# Patient Record
Sex: Female | Born: 1945 | Race: White | Hispanic: No | Marital: Single | Smoking: Never smoker
Health system: Southern US, Community
[De-identification: ages and names within clinical notes are randomized; demographics above are authoritative.]

## PROBLEM LIST (undated history)

## (undated) DIAGNOSIS — E785 Hyperlipidemia, unspecified: Secondary | ICD-10-CM

## (undated) DIAGNOSIS — K219 Gastro-esophageal reflux disease without esophagitis: Secondary | ICD-10-CM

## (undated) DIAGNOSIS — R519 Headache, unspecified: Secondary | ICD-10-CM

## (undated) DIAGNOSIS — R51 Headache: Secondary | ICD-10-CM

## (undated) DIAGNOSIS — K2270 Barrett's esophagus without dysplasia: Secondary | ICD-10-CM

## (undated) HISTORY — DX: Gastro-esophageal reflux disease without esophagitis: K21.9

## (undated) HISTORY — DX: Hyperlipidemia, unspecified: E78.5

## (undated) HISTORY — DX: Headache, unspecified: R51.9

## (undated) HISTORY — PX: BREAST BIOPSY: SHX20

## (undated) HISTORY — DX: Headache: R51

## (undated) HISTORY — PX: ABDOMINAL HYSTERECTOMY: SHX81

## (undated) HISTORY — DX: Barrett's esophagus without dysplasia: K22.70

---

## 1998-02-23 ENCOUNTER — Ambulatory Visit (HOSPITAL_COMMUNITY): Admission: RE | Admit: 1998-02-23 | Discharge: 1998-02-23 | Payer: Self-pay | Admitting: Family Medicine

## 1998-02-23 ENCOUNTER — Encounter: Payer: Self-pay | Admitting: Family Medicine

## 1998-07-11 ENCOUNTER — Ambulatory Visit (HOSPITAL_COMMUNITY): Admission: RE | Admit: 1998-07-11 | Discharge: 1998-07-11 | Payer: Self-pay | Admitting: Gastroenterology

## 1998-07-11 ENCOUNTER — Encounter: Payer: Self-pay | Admitting: Gastroenterology

## 1998-08-08 ENCOUNTER — Ambulatory Visit (HOSPITAL_COMMUNITY): Admission: RE | Admit: 1998-08-08 | Discharge: 1998-08-08 | Payer: Self-pay | Admitting: Gastroenterology

## 1998-09-19 ENCOUNTER — Ambulatory Visit (HOSPITAL_COMMUNITY): Admission: RE | Admit: 1998-09-19 | Discharge: 1998-09-19 | Payer: Self-pay | Admitting: Gastroenterology

## 1999-01-15 HISTORY — PX: FACIAL COSMETIC SURGERY: SHX629

## 1999-07-02 ENCOUNTER — Other Ambulatory Visit: Admission: RE | Admit: 1999-07-02 | Discharge: 1999-07-02 | Payer: Self-pay | Admitting: Orthopedic Surgery

## 2000-07-22 ENCOUNTER — Encounter: Admission: RE | Admit: 2000-07-22 | Discharge: 2000-07-22 | Payer: Self-pay | Admitting: Family Medicine

## 2000-07-22 ENCOUNTER — Encounter: Payer: Self-pay | Admitting: Gastroenterology

## 2001-05-05 ENCOUNTER — Ambulatory Visit (HOSPITAL_COMMUNITY): Admission: RE | Admit: 2001-05-05 | Discharge: 2001-05-05 | Payer: Self-pay | Admitting: Gastroenterology

## 2005-07-19 ENCOUNTER — Other Ambulatory Visit: Admission: RE | Admit: 2005-07-19 | Discharge: 2005-07-19 | Payer: Self-pay | Admitting: Obstetrics and Gynecology

## 2006-03-28 ENCOUNTER — Ambulatory Visit (HOSPITAL_BASED_OUTPATIENT_CLINIC_OR_DEPARTMENT_OTHER): Admission: RE | Admit: 2006-03-28 | Discharge: 2006-03-28 | Payer: Self-pay | Admitting: General Surgery

## 2010-02-04 ENCOUNTER — Encounter: Payer: Self-pay | Admitting: General Surgery

## 2012-06-22 ENCOUNTER — Other Ambulatory Visit: Payer: Self-pay | Admitting: Family Medicine

## 2012-06-22 ENCOUNTER — Ambulatory Visit
Admission: RE | Admit: 2012-06-22 | Discharge: 2012-06-22 | Disposition: A | Discharge status: Home / Self Care | Payer: BC Managed Care – PPO | Source: Ambulatory Visit | Attending: Family Medicine | Admitting: Family Medicine

## 2012-06-22 DIAGNOSIS — M549 Dorsalgia, unspecified: Secondary | ICD-10-CM

## 2012-06-22 IMAGING — CR DG LUMBAR SPINE COMPLETE 4+V
5 series · 5 of 5 positions shown · non-contrast
Comparison: None.

CLINICAL DATA: Low back pain

LUMBAR SPINE - COMPLETE 4+ VIEW

[view not recorded (1 of 5)]
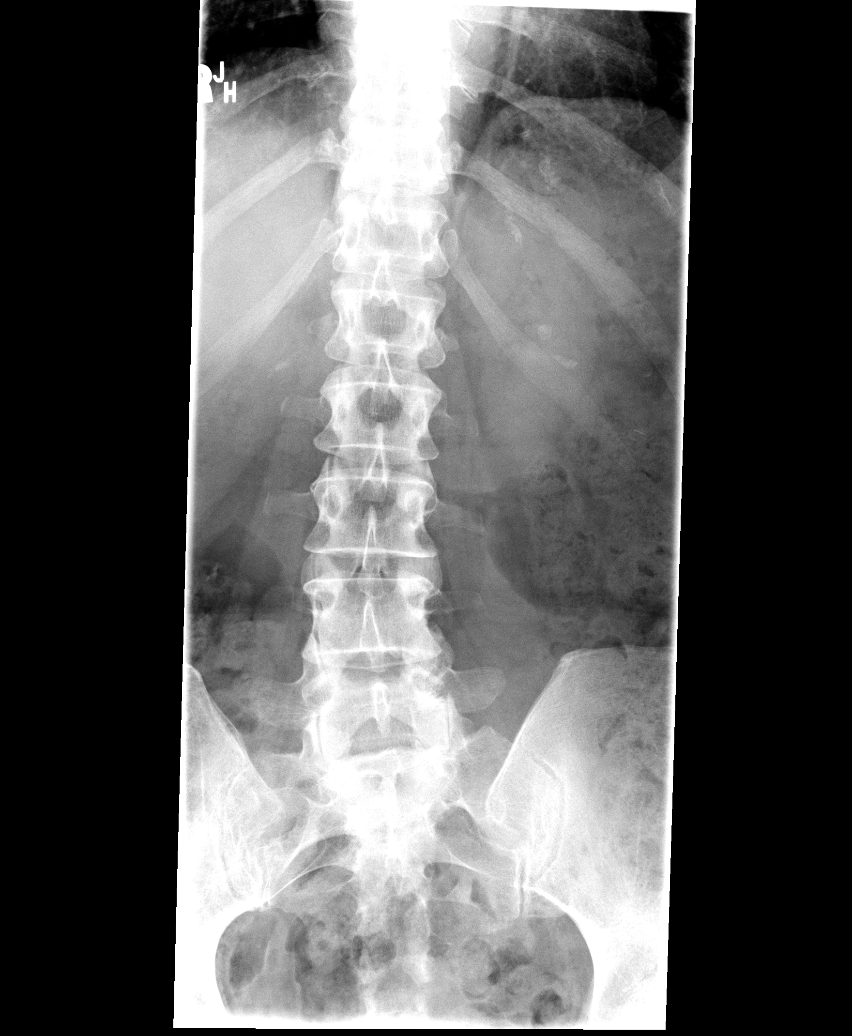

[view not recorded (2 of 5)]
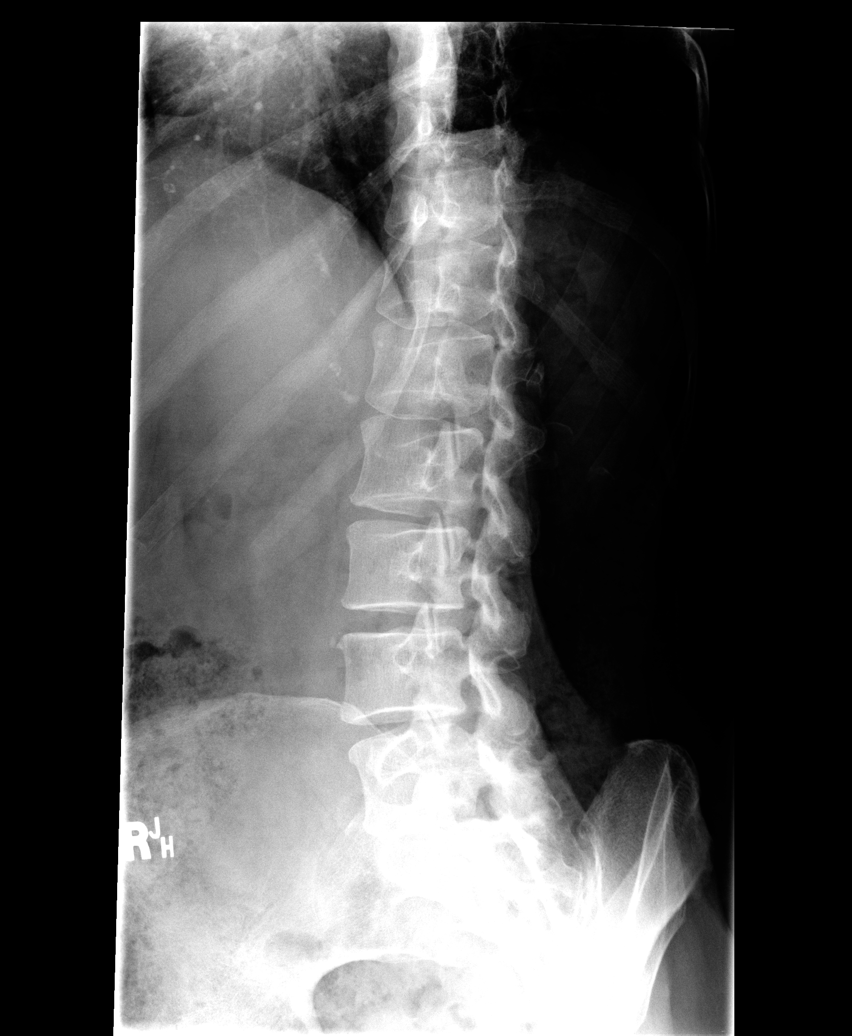

[view not recorded (3 of 5)]
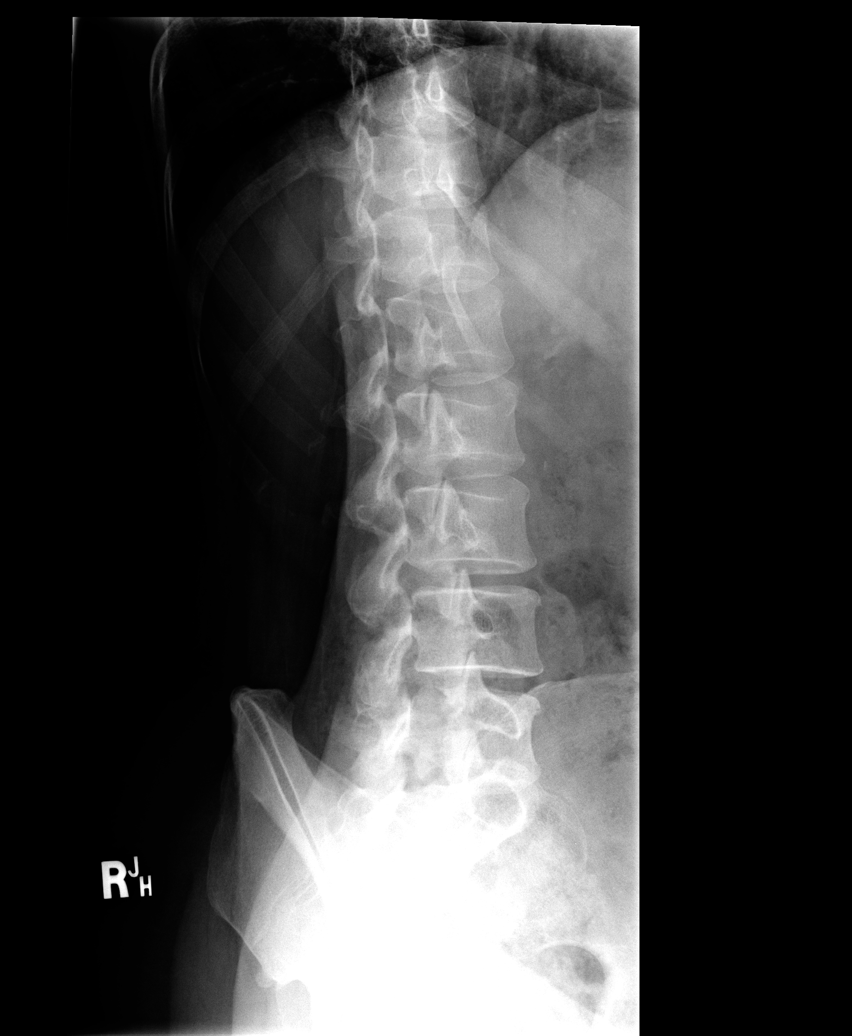

[view not recorded (4 of 5)]
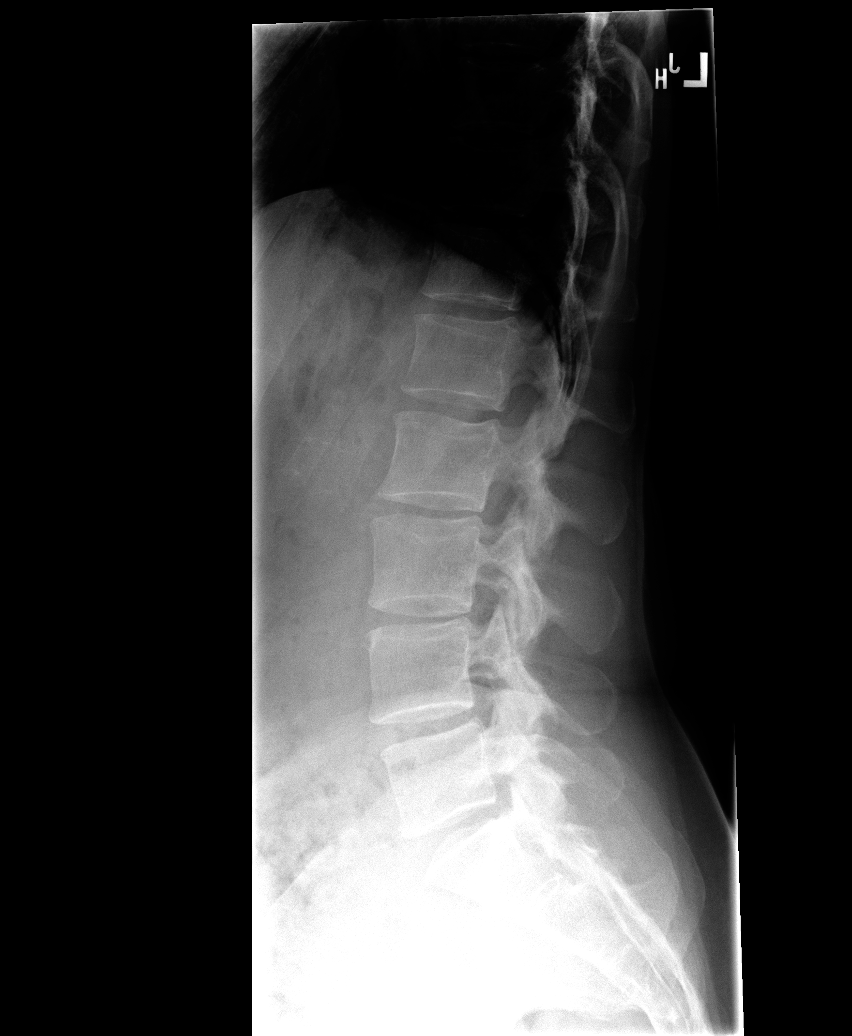

[view not recorded (5 of 5)]
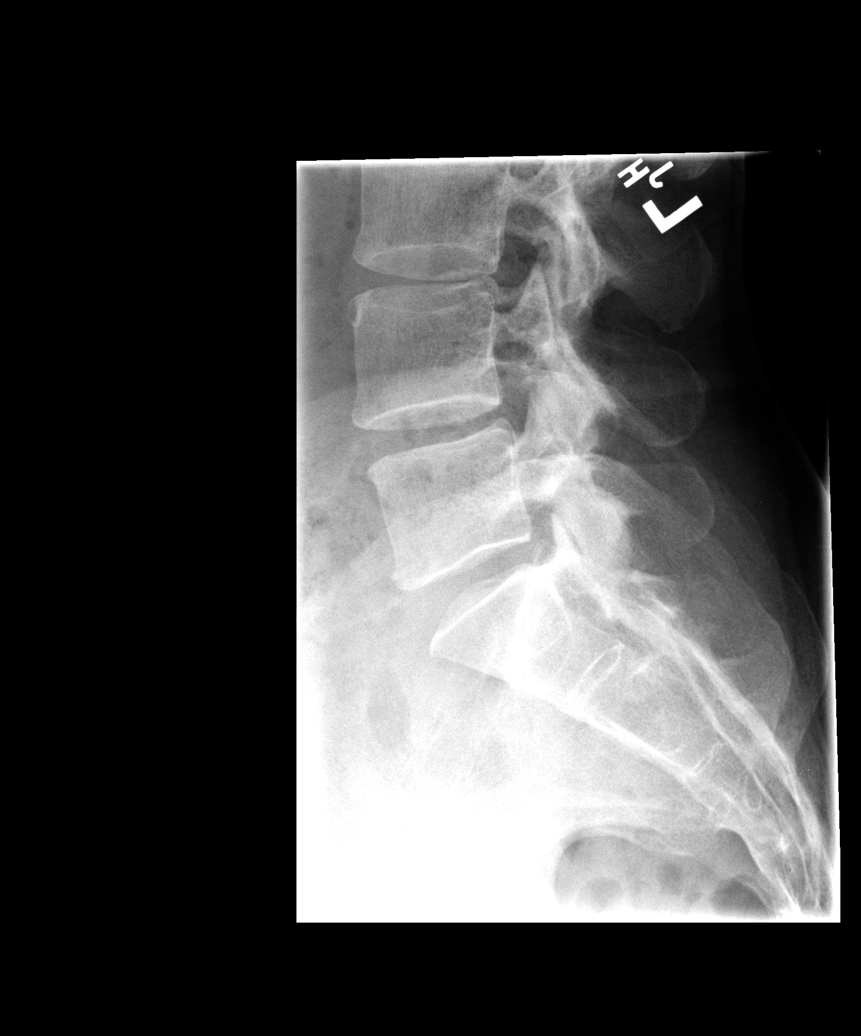

[5 of 5 positions shown; findings below may reference images not displayed]

FINDINGS: Mild dextroscoliosis of the lumbar spine apex L3 without
evident underlying vertebral anomaly.  Mild narrowing L4-5.
Asymmetric facet degenerative hypertrophy L4-5, left greater than
right.    Negative for fracture.
IMPRESSION: 1.  Negative for fracture or other acute bony abnormality.
2.  Degenerative disc and facet disease L4-5.

## 2013-12-03 ENCOUNTER — Other Ambulatory Visit: Payer: Self-pay | Admitting: Family Medicine

## 2013-12-03 DIAGNOSIS — R51 Headache: Principal | ICD-10-CM

## 2013-12-03 DIAGNOSIS — R519 Headache, unspecified: Secondary | ICD-10-CM

## 2013-12-15 ENCOUNTER — Other Ambulatory Visit: Payer: BC Managed Care – PPO

## 2014-02-02 ENCOUNTER — Ambulatory Visit (HOSPITAL_COMMUNITY)
Admission: RE | Admit: 2014-02-02 | Discharge: 2014-02-02 | Disposition: A | Discharge status: Home / Self Care | Payer: BC Managed Care – PPO | Source: Ambulatory Visit | Attending: Neurology | Admitting: Neurology

## 2014-02-02 ENCOUNTER — Ambulatory Visit (INDEPENDENT_AMBULATORY_CARE_PROVIDER_SITE_OTHER): Payer: BC Managed Care – PPO | Admitting: Neurology

## 2014-02-02 ENCOUNTER — Other Ambulatory Visit: Payer: Self-pay | Admitting: Neurology

## 2014-02-02 ENCOUNTER — Encounter: Payer: Self-pay | Admitting: Neurology

## 2014-02-02 VITALS — BP 138/78 | HR 86 | Ht 65.0 in | Wt 136.0 lb

## 2014-02-02 DIAGNOSIS — R51 Headache: Secondary | ICD-10-CM | POA: Diagnosis present

## 2014-02-02 DIAGNOSIS — G4486 Cervicogenic headache: Secondary | ICD-10-CM

## 2014-02-02 DIAGNOSIS — M542 Cervicalgia: Secondary | ICD-10-CM

## 2014-02-02 DIAGNOSIS — M5032 Other cervical disc degeneration, mid-cervical region: Secondary | ICD-10-CM | POA: Diagnosis not present

## 2014-02-02 IMAGING — DX DG CERVICAL SPINE 2 OR 3 VIEWS
2 series · 2 of 2 positions shown · non-contrast
Comparison: None.

CLINICAL DATA: Headaches.

EXAM:
CERVICAL SPINE - 2-3 VIEW

[c-spine lat]
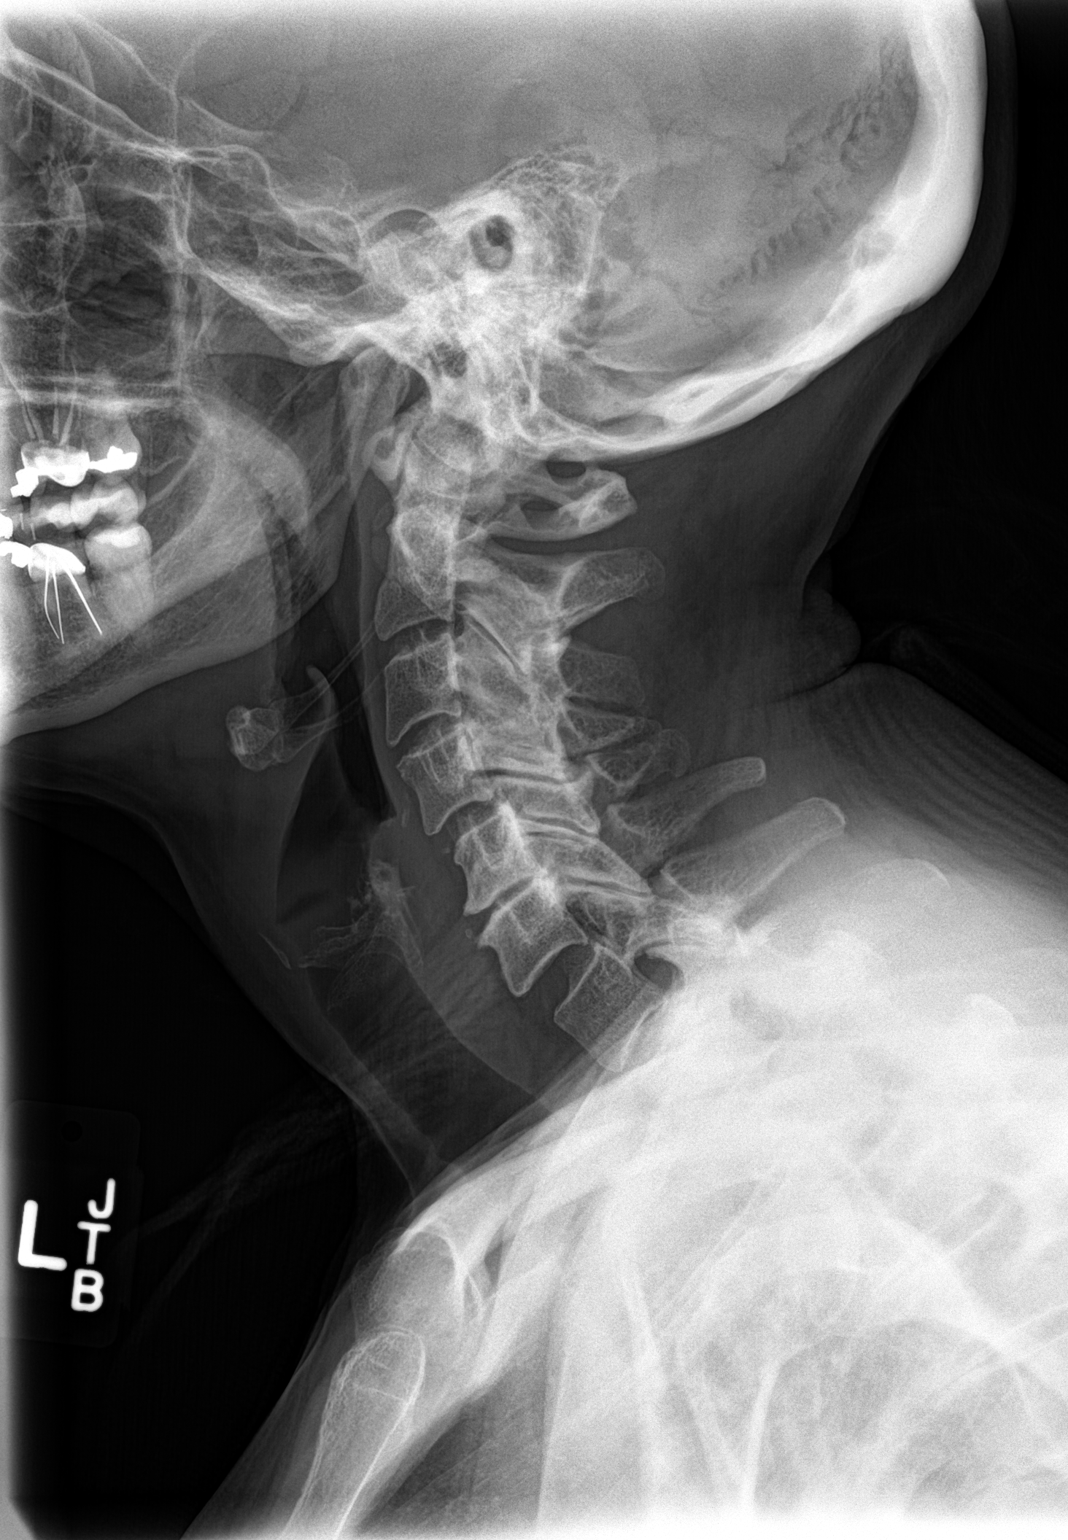

[c-spine ap]
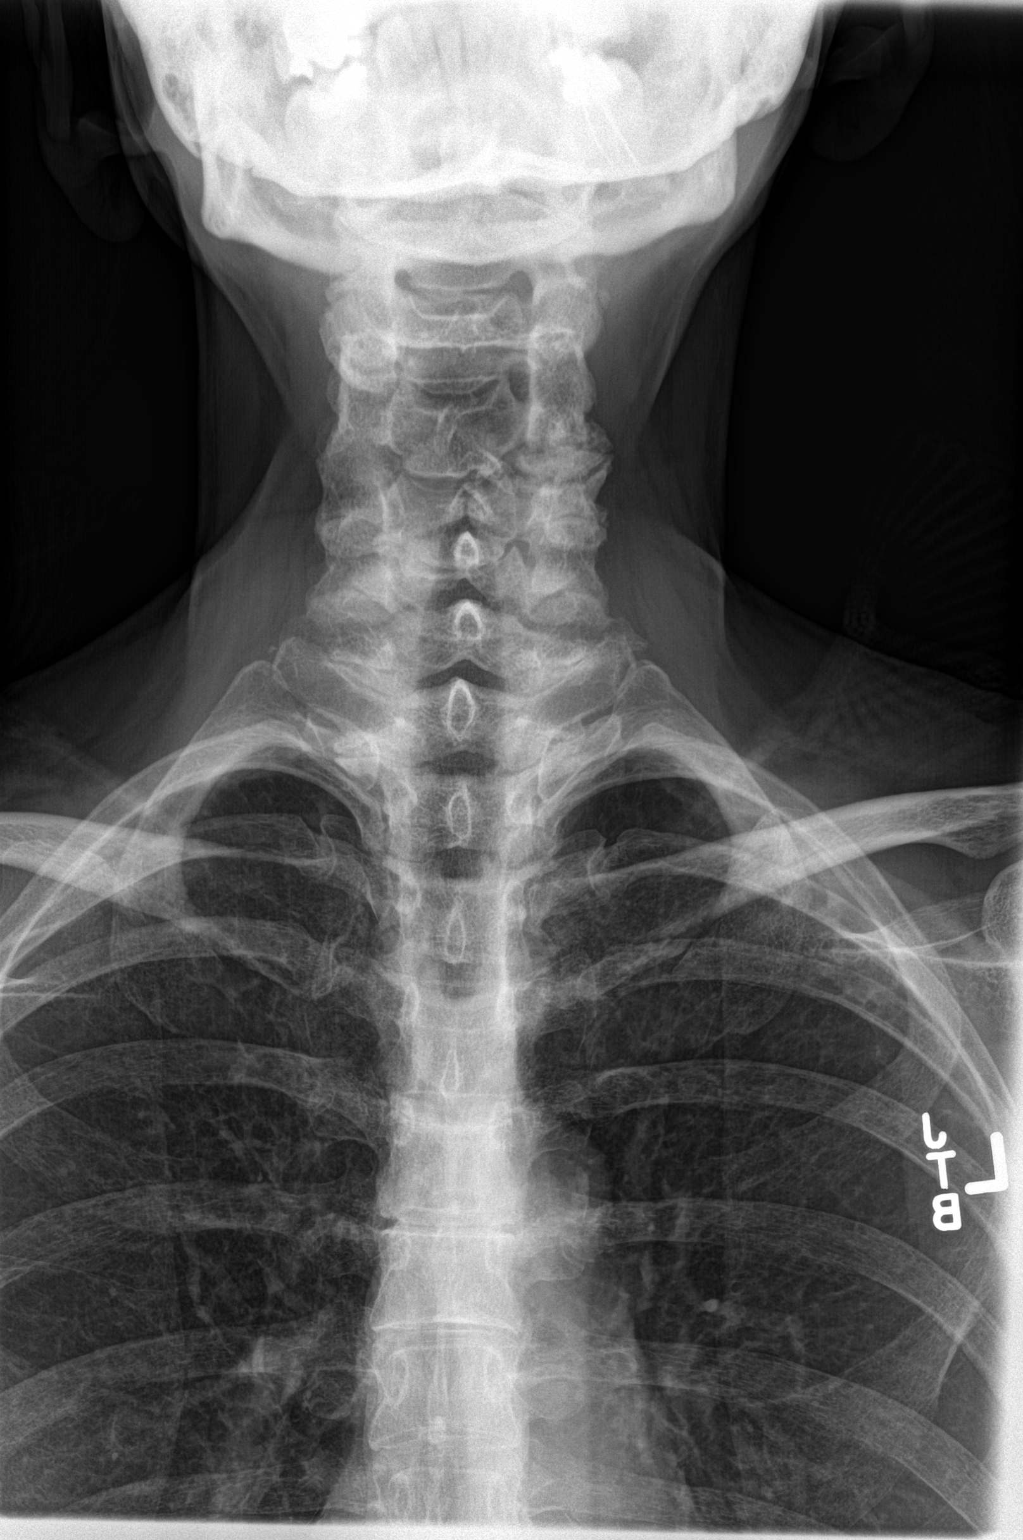

[2 of 2 positions shown; findings below may reference images not displayed]

FINDINGS: The cervical spine is visualized from the occiput to the
cervicothoracic junction. Alignment is anatomic. Vertebral body
height is maintained. Prevertebral soft tissues are within normal
limits. Endplate degenerative changes, loss of disc space height and
uncovertebral hypertrophy are worst at C5-6. Multilevel facet
hypertrophy. Mild biapical pleural parenchymal scarring, left
greater than right, on AP view.
IMPRESSION: Degenerative changes, worst at C5-6.

## 2014-02-02 MED ORDER — METHYLPREDNISOLONE (PAK) 4 MG PO TABS: ORAL_TABLET | ORAL | Status: DC

## 2014-02-02 NOTE — Patient Instructions (Addendum)
I think the headaches are coming from the neck.  1.  We will get an Xray of the cervical spine.  2.  We will also refer you to physical therapy for the neck 3.  I will prescribe you a Medrol Dose Pak, which is a short course of steroid to decrease inflammation and relieve the pain. 4.  I want you to follow up in 4 weeks.  If physical therapy ineffective, we will look at other options as far as tests and treatment. 5.  Also, consider alternating between ice and heat

## 2014-02-02 NOTE — Progress Notes (Signed)
NEUROLOGY CONSULTATION NOTE  [name] MRN: [id number] DOB: November 26, 1945  Referring provider: Dr. Katrinka Blazing Primary care provider: Dr. Katrinka Blazing  Reason for consult:  Headache  HISTORY OF PRESENT ILLNESS: Bailey Ford is a 69 year old right-handed woman with hyperlipidemia who presents for headache.  It started about 5 months ago.  At first, she thought it ws related to allergies, but it persisted.  It is located in the left suboccipital region, radiating to left posterior auricular region.  Intensity of the pain varies, but it is constant.  There is associated neck soreness as well.  Moving her neck to either side aggravates it, but it is more aggravated when turning her neck to the left.  There is no associated numbness or tingling of the scalp or face.  There is no dizziness, visual disturbance, nausea or photophobia or phonophobia.  She denies numbness or tingling in the hands or arms.  She denies any trauma or precipitating strenuous activity.  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  She reports that the head and neck pain was improved during the Christmas break, when she wasn't on the computer all day.  Neck pain is okay in the morning but gradually gets worse throughout the day.  She tried tizanidine which was ineffective.  Tylenol was ineffective.  She currently takes ibuprofen every 4 to 6 hours, which helps relieve it but not abort it.  She has no prior history of headache.   PAST MEDICAL HISTORY: Past Medical History  Diagnosis Date  . Hyperlipidemia   . GERD (gastroesophageal reflux disease)   . Headache     PAST SURGICAL HISTORY: Past Surgical History  Procedure Laterality Date  . Facial cosmetic surgery  2001  . Abdominal hysterectomy    . Breast biopsy      non cancerous    MEDICATIONS: No current outpatient prescriptions on file prior to visit.   No current facility-administered  medications on file prior to visit.    ALLERGIES: No Known Allergies  FAMILY HISTORY: Family History  Problem Relation Age of Onset  . Heart disease Mother   . Heart disease Father   . Liver cancer Sister   . Rheum arthritis Mother   . Pneumonia Brother     SOCIAL HISTORY: History   Social History  . Marital Status: Single    Spouse Name: N/A    Number of Children: N/A  . Years of Education: N/A   Occupational History  . Not on file.   Social History Main Topics  . Smoking status: Never Smoker   . Smokeless tobacco: Not on file  . Alcohol Use: 0.0 oz/week    0 Not specified per week     Comment: once a week  . Drug Use: No  . Sexual Activity: Not on file   Other Topics Concern  . Not on file   Social History Narrative  . No narrative on file    REVIEW OF SYSTEMS: Constitutional: No fevers, chills, or sweats, no generalized fatigue, change in appetite Eyes: No visual changes, double vision, eye pain Ear, nose and throat: No hearing loss, ear pain, nasal congestion, sore throat Cardiovascular: No chest pain, palpitations Respiratory:  No shortness of breath at rest or with exertion, wheezes GastrointestinaI: No nausea, vomiting, diarrhea, abdominal pain, fecal incontinence Genitourinary:  No dysuria, urinary retention or frequency Musculoskeletal:  Neck pain Integumentary: No rash, pruritus, skin  lesions Neurological: as above Psychiatric: No depression, insomnia, anxiety Endocrine: No palpitations, fatigue, diaphoresis, mood swings, change in appetite, change in weight, increased thirst Hematologic/Lymphatic:  No anemia, purpura, petechiae. Allergic/Immunologic: no itchy/runny eyes, nasal congestion, recent allergic reactions, rashes  PHYSICAL EXAM: Filed Vitals:   02/02/14 1511  BP: 138/78  Pulse: 86   General: No acute distress Head:  Normocephalic/atraumatic Eyes:  fundi unremarkable, without vessel changes, exudates, hemorrhages or  papilledema. Neck: supple, left sided suboccipital and paraspinal tenderness, full range of motion Back: No paraspinal tenderness Heart: regular rate and rhythm Lungs: Clear to auscultation bilaterally. Vascular: No carotid bruits. Neurological Exam: Mental status: alert and oriented to person, place, and time, recent and remote memory intact, fund of knowledge intact, attention and concentration intact, speech fluent and not dysarthric, language intact. Cranial nerves: CN I: not tested CN II: pupils equal, round and reactive to light, visual fields intact, fundi unremarkable, without vessel changes, exudates, hemorrhages or papilledema. CN III, IV, VI:  full range of motion, no nystagmus, no ptosis CN V: facial sensation intact CN VII: upper and lower face symmetric CN VIII: hearing intact CN IX, X: gag intact, uvula midline CN XI: sternocleidomastoid and trapezius muscles intact CN XII: tongue midline Bulk & Tone: normal, no fasciculations. Motor:  5/5 throughout Sensation:  Temperature and vibration intact. Deep Tendon Reflexes:  2+ throughout, toes downgoing. Finger to nose testing:  Mild intention tremor.  No dysmetria Gait:  Normal station and stride.  Able to turn and walk in tandem. Romberg negative.  IMPRESSION: Cervicogenic headache.  I don't suspect that there is any intracranial process causing her headache at this time. Cervical neck pain  PLAN: 1.  We will first get an X-ray of the cervical spine to look for any obvious arthritis. 2.  Will prescribe Medrol Dose Pak and referral to PT 3.  Alternate between ice and heat. 4.  She will follow up in 4 weeks.  If PT is ineffective, will look at other testing (such as MRI of the cervical spine) or management.  Thank you for allowing me to take part in the care of this patient.  Shon MilletAdam Lindsee Labarre, DO  CC:  Merri Brunetteandace Smith, MD

## 2014-02-03 ENCOUNTER — Telehealth: Payer: Self-pay | Admitting: *Deleted

## 2014-02-03 NOTE — Telephone Encounter (Signed)
-----   Message from Cira ServantAdam Robert Jaffe, DO sent at 02/03/2014  6:56 AM EST ----- There are some mild degenerative changes in the spine, but nothing too significant.  I would proceed with PT of the neck. ----- Message -----    From: Rad Results In Interface    Sent: 02/02/2014   5:02 PM      To: Cira ServantAdam Robert Jaffe, DO

## 2014-02-03 NOTE — Telephone Encounter (Signed)
Patient is aware of XRAY  and was ask to call next week if she has not  heard from PT

## 2014-02-09 ENCOUNTER — Other Ambulatory Visit: Payer: Self-pay | Admitting: *Deleted

## 2014-02-09 ENCOUNTER — Telehealth: Payer: Self-pay | Admitting: *Deleted

## 2014-02-09 DIAGNOSIS — M542 Cervicalgia: Secondary | ICD-10-CM

## 2014-02-09 NOTE — Telephone Encounter (Signed)
Patient has a att for neuro rhab with PT 02/11/14 at 10:15 am

## 2014-02-11 ENCOUNTER — Ambulatory Visit: Payer: BC Managed Care – PPO | Admitting: Rehabilitative and Restorative Service Providers"

## 2014-02-25 ENCOUNTER — Ambulatory Visit: Payer: BC Managed Care – PPO | Attending: Neurology | Admitting: Rehabilitative and Restorative Service Providers"

## 2014-02-25 DIAGNOSIS — M542 Cervicalgia: Secondary | ICD-10-CM | POA: Diagnosis present

## 2014-02-25 NOTE — Patient Instructions (Addendum)
Thoracic Self-Mobilization (Supine)   With rolled towel placed lengthwise at lower ribs level, lie back on towel with arms outstretched. Hold __2 minutes. Relax. Repeat __1__ times per set. Do _2___ sessions per day.  http://orth.exer.us/1001   Copyright  VHI. All rights reserved.   NECK: Capital Extension   Sitting: Tuck chin, move head and neck backward. Lying Down: Tuck chin, push back of neck into pillow. __10_ reps per set, __1_ sets per day, _2__ days per week  Copyright  VHI. All rights reserved.    AROM: Lateral Neck Flexion   Slowly tilt head toward one shoulder, then the other. Hold each position __10__ seconds. Repeat __5__ times per set.  Do __2__ sessions per day. STAND IN FRONT OF A MIRROR TO ENSURE YOUR SHOULDERS STAY LEVEL. *can be done more as needed to stretch after sitting in front of the computer  http://orth.exer.us/297   Copyright  VHI. All rights reserved.   Shoulder Circle Shrug   Bring shoulders up and rotate around backward. Repeat __10__ times. Do __2__ sessions per day. *Can do more if needed after working on the computer*  http://gt2.exer.us/15   Copyright  VHI. All rights reserved.

## 2014-02-25 NOTE — Therapy (Signed)
[facility] [address] [place], [place], [address] Phone: [phone]   Fax:  [phone]  Physical Therapy Evaluation  Patient Details  Name: Bailey Ford MRN: [id number] Date of Birth: 03/25/1945 Referring Provider:  Allean FoundSmith, Candace Thiele, *  Encounter Date: 02/25/2014      PT End of Session - 02/25/14 1651    Visit Number 1   Number of Visits 4   Date for PT Re-Evaluation 03/26/14   PT Start Time 1448   PT Stop Time 1532   PT Time Calculation (min) 44 min   Activity Tolerance Patient tolerated treatment well   Behavior During Therapy [facility] for tasks assessed/performed      Past Medical History  Diagnosis Date  . Hyperlipidemia   . GERD (gastroesophageal reflux disease)   . Headache     Past Surgical History  Procedure Laterality Date  . Facial cosmetic surgery  2001  . Abdominal hysterectomy    . Breast biopsy      non cancerous    There were no vitals taken for this visit.  Visit Diagnosis:  Neck pain      Subjective Assessment - 02/25/14 1451    Symptoms The patient reports onset of headache and pressure behind her eyes at the end of the summer.  Pt felt this may be related to allergies.  She reports symptoms did not clear up with allergy medications.  She saw Dr. Everlena CooperJaffe for neurology and provided her a steroid pack and she noticed relief initially, but symptoms have returned.     Patient Stated Goals Decrease pain   Currently in Pain? Yes   Pain Score 6    Pain Location Neck   Pain Descriptors / Indicators Aching   Pain Radiating Towards R ear   Pain Onset More than a month ago   Pain Frequency Constant   Aggravating Factors  Wakes up and symptoms not bad, symptoms start mid way through work day.     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          PT Education - 02/25/14 1651    Education provided Yes   Education Details HEP: towel roll stretch, chin tuck, cervical rotation, shoulder rolls  Person(s) Educated Patient   Methods Explanation;Demonstration;Handout   Comprehension Verbalized understanding;Returned demonstration             PT Long Term Goals - 02/25/14 1655    PT LONG TERM GOAL #1   Title The patient will be indep with HEP for post d/c stretching, strengthening.  Target date 03/26/2014.   Time 4   Period Weeks   PT LONG TERM GOAL #2   Title The patient will improve A/ROM rotation to > or equal to 60 degrees bilaterally.  Target date 03/26/2014.   Time 4   Period Weeks   PT LONG TERM GOAL #3   Title The patient will imrpove NDI by 8% (from 20%).  Target date 03/26/2014.   Time 4   Period Weeks   PT LONG TERM GOAL #4   Title The patient will verbalize understanding of work space set up to decrease postural stresses.  Target date 03/26/2014.   Time 4   Period Weeks               Plan - 02/25/14 1652    Clinical Impression Statement The  patient is a 69yo female with neck ROM limitations and tightness noted.  She is active and currently walks, jogs and does push-ups daily.  She spends a large portion of work hours at a computer.  PT to provide HEP, add exercises for upper back strengthening to counterbalance push-ups and computer time, and work set up to optimize body mechanics.     Pt will benefit from skilled therapeutic intervention in order to improve on the following deficits Pain;Impaired flexibility;Postural dysfunction;Decreased range of motion;Decreased mobility   Rehab Potential Good   PT Frequency 1x / week   PT Duration 4 weeks   PT Treatment/Interventions Therapeutic exercise;Patient/family education;Therapeutic activities;Manual techniques   PT Next Visit Plan Check HEP, add upper back t-band strengthening, door frame neural tension stretching, and discuss work space modifications   Consulted and Agree with Plan of Care Patient         Problem List There are no active problems to display for this patient.   Thank you for the referral of this patient.   Lycia Sachdeva, PT 02/25/2014, 4:57 PM  [facility] [address] [place], [place], [address] Phone: [phone]   Fax:  [phone]

## 2014-03-07 ENCOUNTER — Telehealth: Payer: Self-pay | Admitting: Neurology

## 2014-03-07 NOTE — Telephone Encounter (Signed)
Pt called wanting to speak to a nurse to clear up if she needs to be seen by Dr. Everlena CooperJaffe or if she needs to see an orthopedic. Her appt is 03/14/14 C/b [phone]

## 2014-03-07 NOTE — Telephone Encounter (Signed)
Patient is asking should she keep her appt on Monday 03/14/14  With Dr Everlena CooperJaffe? She states PT has  Helped a little however they as well seem to think this is muscle related not neuro . She also ask does she need to see a ortho Dr ? Please advise

## 2014-03-07 NOTE — Telephone Encounter (Signed)
I thought that the neck pain is related to muscle spasms, which in turn contributes to the headaches.  If she is feeling better, then she doesn't need to follow up.  Otherwise, she should keep her appointment.  From my standpoint, I don't think an orthopedist is warranted.

## 2014-03-07 NOTE — Telephone Encounter (Signed)
Patient will keep her follow up appt since she is still having the headache just not as bad

## 2014-03-14 ENCOUNTER — Encounter: Payer: Self-pay | Admitting: Neurology

## 2014-03-14 ENCOUNTER — Ambulatory Visit (INDEPENDENT_AMBULATORY_CARE_PROVIDER_SITE_OTHER): Payer: BC Managed Care – PPO | Admitting: Neurology

## 2014-03-14 VITALS — BP 118/68 | HR 66 | Resp 20 | Ht 65.0 in | Wt 136.4 lb

## 2014-03-14 DIAGNOSIS — R51 Headache: Secondary | ICD-10-CM

## 2014-03-14 DIAGNOSIS — M542 Cervicalgia: Secondary | ICD-10-CM | POA: Insufficient documentation

## 2014-03-14 DIAGNOSIS — G4486 Cervicogenic headache: Secondary | ICD-10-CM

## 2014-03-14 NOTE — Progress Notes (Signed)
NEUROLOGY FOLLOW UP OFFICE NOTE  Bailey AppleShelly J Ford [id number]  HISTORY OF PRESENT ILLNESS: Bailey GuernseyShelly Ford is a 69 year old right-handed woman with hyperlipidemia who follows up for neck pain and cervicogenic headache.  X-ray of cervical spine reviewed.  UPDATE: She had an X-ray of the cervical spine on 02/02/14 which showed some degenerative changes, worst at C5-6.  She was prescribed a Medrol Dose Pak and referred to PT.  The headache and neck pain improved after the Dose Pak for a little while.  She only went to one session with PT because they were booked out.  They gave her home exercises, which have minimally helped.  The headache isn't constant but it comes and goes at various times.  She is not interested in medications such as TCA or muscle relaxant.  HISTORY: It started about 7 months ago.  At first, she thought it ws related to allergies, but it persisted.  It is located in the left suboccipital region, radiating to left posterior auricular region.  Intensity of the pain varies, but it is constant.  There is associated neck soreness as well.  Moving her neck to either side aggravates it, but it is more aggravated when turning her neck to the left.  There is no associated numbness or tingling of the scalp or face.  There is no dizziness, visual disturbance, nausea or photophobia or phonophobia.  She denies numbness or tingling in the hands or arms.  She denies any trauma or precipitating strenuous activity.  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  She reports that the head and neck pain was improved during the Christmas break, when she wasn't on the computer all day.  Neck pain is okay in the morning but gradually gets worse throughout the day.  She tried tizanidine which was ineffective.  Tylenol was ineffective.  She currently takes ibuprofen every 4 to 6 hours, which helps relieve it but not abort it.  She  has no prior history of headache  PAST MEDICAL HISTORY: Past Medical History  Diagnosis Date  . Hyperlipidemia   . GERD (gastroesophageal reflux disease)   . Headache     MEDICATIONS: Current Outpatient Prescriptions on File Prior to Visit  Medication Sig Dispense Refill  . atorvastatin (LIPITOR) 10 MG tablet Take 10 mg by mouth daily.   0  . BIOTIN PO Take by mouth daily.    Marland Kitchen. CALCIUM PO Take by mouth daily.    . Fish Oil-Cholecalciferol (FISH OIL + D3 PO) Take by mouth daily.    Marland Kitchen. ibuprofen (ADVIL,MOTRIN) 600 MG tablet Take 600 mg by mouth every 6 (six) hours as needed.    . Multiple Vitamin (MULTIVITAMIN) tablet Take 1 tablet by mouth daily.    . pantoprazole (PROTONIX) 40 MG tablet Take 40 mg by mouth daily.   5  . PREMARIN 1.25 MG tablet Take 1.25 mg by mouth daily.   4  . methylPREDNIsolone (MEDROL DOSPACK) 4 MG tablet follow package directions (Patient not taking: Reported on 03/14/2014) 21 tablet 0   No current facility-administered medications on file prior to visit.    ALLERGIES: No Known Allergies  FAMILY HISTORY: Family History  Problem Relation Age of Onset  . Heart disease Mother   . Heart disease Father   . Liver cancer Sister   . Rheum arthritis Mother   . Pneumonia Brother     SOCIAL HISTORY: History  Social History  . Marital Status: Single    Spouse Name: N/A  . Number of Children: N/A  . Years of Education: N/A   Occupational History  . Not on file.   Social History Main Topics  . Smoking status: Never Smoker   . Smokeless tobacco: Never Used  . Alcohol Use: 0.0 oz/week    0 Standard drinks or equivalent per week     Comment: once a week  . Drug Use: No  . Sexual Activity:    Partners: Male   Other Topics Concern  . Not on file   Social History Narrative    REVIEW OF SYSTEMS: Constitutional: No fevers, chills, or sweats, no generalized fatigue, change in appetite Eyes: No visual changes, double vision, eye pain Ear, nose and  throat: No hearing loss, ear pain, nasal congestion, sore throat Cardiovascular: No chest pain, palpitations Respiratory:  No shortness of breath at rest or with exertion, wheezes GastrointestinaI: No nausea, vomiting, diarrhea, abdominal pain, fecal incontinence Genitourinary:  No dysuria, urinary retention or frequency Musculoskeletal:  Neck pain Integumentary: No rash, pruritus, skin lesions Neurological: as above Psychiatric: No depression, insomnia, anxiety Endocrine: No palpitations, fatigue, diaphoresis, mood swings, change in appetite, change in weight, increased thirst Hematologic/Lymphatic:  No anemia, purpura, petechiae. Allergic/Immunologic: no itchy/runny eyes, nasal congestion, recent allergic reactions, rashes  PHYSICAL EXAM: Filed Vitals:   03/14/14 1419  BP: 118/68  Pulse: 66  Resp: 20   General: No acute distress Head:  Normocephalic/atraumatic Eyes:  Fundoscopic exam unremarkable without vessel changes, exudates, hemorrhages or papilledema. Neck: supple, bilateral tenderness, full range of motion Heart:  Regular rate and rhythm Lungs:  Clear to auscultation bilaterally Back: No paraspinal tenderness Neurological Exam: alert and oriented to person, place, and time. Attention span and concentration intact, recent and remote memory intact, fund of knowledge intact.  Speech fluent and not dysarthric, language intact.  CN II-XII intact. Fundoscopic exam unremarkable without vessel changes, exudates, hemorrhages or papilledema.  Bulk and tone normal, muscle strength 5/5 throughout.  Sensation to light touch intact.  Deep tendon reflexes 2+ throughout.  Finger to nose testing intact.  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  Since she would rather not take medication, we will refer her to Dr. Antoine Primas of Sports Medicine  2.  She will follow up in 3 months  15 minutes spent with patient, over 50% spent  discussing etiology of pain and coordinating management.  Shon Millet, DO  CC:  Merri Brunette, MD

## 2014-03-14 NOTE — Patient Instructions (Signed)
1.  We will refer you to Dr. Antoine PrimasZachary Smith. 2.  Follow up 3 months

## 2014-03-21 ENCOUNTER — Encounter: Payer: Self-pay | Admitting: Family Medicine

## 2014-03-21 ENCOUNTER — Ambulatory Visit (INDEPENDENT_AMBULATORY_CARE_PROVIDER_SITE_OTHER): Payer: BC Managed Care – PPO | Admitting: Family Medicine

## 2014-03-21 ENCOUNTER — Encounter: Payer: Self-pay | Admitting: *Deleted

## 2014-03-21 VITALS — BP 126/72 | HR 65 | Ht 65.0 in | Wt 135.0 lb

## 2014-03-21 DIAGNOSIS — G4486 Cervicogenic headache: Secondary | ICD-10-CM

## 2014-03-21 DIAGNOSIS — R51 Headache: Secondary | ICD-10-CM | POA: Diagnosis not present

## 2014-03-21 DIAGNOSIS — M999 Biomechanical lesion, unspecified: Secondary | ICD-10-CM

## 2014-03-21 DIAGNOSIS — M9901 Segmental and somatic dysfunction of cervical region: Secondary | ICD-10-CM

## 2014-03-21 NOTE — Progress Notes (Signed)
  Tawana ScaleZach Smith D.O. [facility] [address] [place], [place] [address] Phone: [phone] Subjective:    I'm seeing this patient by the request  of:  Everlena CooperJaffe, MD  CC: Headaches  BJY:NWGNFAOZHYHPI:Subjective Romeo AppleShelly J Arlen is a 69 y.o. female coming in with complaint of headaches. Patient has had headaches off and on for quite some time. Patient states it seemed to get worse over the course of the fall and unfortunate since then has had this intermittent spontaneous headaches. Patient states it seems to only start on the right side of her neck and then radiates towards the right side of her head. Denies any throbbing, denies any numbness or tingling in the extremities, denies any photophobia or any nausea. Patient states that it can be fairly painful and she tries to not take any medications for. Does not stop her from any activities but does notice when she does a lot of more repetitive activity at work such as looking at her computer screens she can have more discomfort. Patient has been seen by neurology and neurologic exam was unremarkable. Patient has had x-rays of her neck that shows some mild degenerative changes mostly at C5-C6. These were reviewed by me today.   rates the severity of pain is 6 out of 10.  Past medical history, social, surgical and family history all reviewed in electronic medical record.   Review of Systems: No headache, visual changes, nausea, vomiting, diarrhea, constipation, dizziness, abdominal pain, skin rash, fevers, chills, night sweats, weight loss, swollen lymph nodes, body aches, joint swelling, muscle aches, chest pain, shortness of breath, mood changes.   Objective Blood pressure 126/72, pulse 65, height 5\' 5"  (1.651 m), weight 135 lb (61.236 kg), SpO2 97 %.  General: No apparent distress alert and oriented x3 mood and affect normal, dressed appropriately.  HEENT: Pupils equal, extraocular movements intact  Respiratory: Patient's speak in full sentences  and does not appear short of breath  Cardiovascular: No lower extremity edema, non tender, no erythema  Skin: Warm dry intact with no signs of infection or rash on extremities or on axial skeleton.  Abdomen: Soft nontender  Neuro: Cranial nerves II through XII are intact, neurovascularly intact in all extremities with 2+ DTRs and 2+ pulses.  Lymph: No lymphadenopathy of posterior or anterior cervical chain or axillae bilaterally.  Gait normal with good balance and coordination.  MSK:  Non tender with full range of motion and good stability and symmetric strength and tone of shoulders, elbows, wrist, hip, knee and ankles bilaterally.  Neck: Inspection unremarkable. She no does have significant increased kyphosis of the thoracic spine No palpable stepoffs. Negative Spurling's maneuver. Full neck range of motion Grip strength and sensation normal in bilateral hands Strength good C4 to T1 distribution No sensory change to C4 to T1 Negative Hoffman sign bilaterally Reflexes normal Tender over C2.  Osteopathic findings Cervical C2 flexed rotated and side bent left C4 flexed rotated and side bent right Thoracic T5 extended rotated and side bent right  Procedure note 97110; 15 minutes spent for Therapeutic exercises as stated in above notes.  This included exercises focusing on stretching, strengthening, with significant focus on eccentric aspects.   Proper technique shown and discussed handout in great detail with ATC.  All questions were discussed and answered.      Impression and Recommendations:     This case required medical decision making of moderate complexity.

## 2014-03-21 NOTE — Patient Instructions (Addendum)
Good to see you Consider standing desk.  Increase omega 3 foods and decrease omega 6 foods.  Vitamin D 2000 IU daily Turmeric  twice daily Fish oil 2 grams daily Consider limiting caffeine and chocolate a little bit 2 tennis ball in tube sock and lay on it when feel tightening go away.  Tennis ball between shoulder blades when sitting.  Try exercises 3 times a week.  See me again in 3 weeks      Standing:  Secure a rubber exercise band/tubing so that it is at the height of your shoulders when you are either standing or sitting on a firm arm-less chair.  Grasp an end of the band/tubing in each hand and have your palms face each other. Straighten your elbows and lift your hands straight in front of you at shoulder height. Step back away from the secured end of band/tubing until it becomes tense.  Squeeze your shoulder blades together. Keeping your elbows locked and your hands at shoulder-height, bring your hands out to your side.  Hold __________ seconds. Slowly ease the tension on the band/tubing as you reverse the directions and return to the starting position. Repeat __________ times. Complete this exercise __________ times per day. STRENGTH - Scapular Retractors  Secure a rubber exercise band/tubing so that it is at the height of your shoulders when you are either standing or sitting on a firm arm-less chair.  With a palm-down grip, grasp an end of the band/tubing in each hand. Straighten your elbows and lift your hands straight in front of you at shoulder height. Step back away from the secured end of band/tubing until it becomes tense.  Squeezing your shoulder blades together, draw your elbows back as you bend them. Keep your upper arm lifted away from your body throughout the exercise.  Hold __________ seconds. Slowly ease the tension on the band/tubing as you reverse the directions and return to the starting position. Repeat __________ times. Complete this exercise  __________ times per day. STRENGTH - Shoulder Extensors   Secure a rubber exercise band/tubing so that it is at the height of your shoulders when you are either standing or sitting on a firm arm-less chair.  With a thumbs-up grip, grasp an end of the band/tubing in each hand. Straighten your elbows and lift your hands straight in front of you at shoulder height. Step back away from the secured end of band/tubing until it becomes tense.  Squeezing your shoulder blades together, pull your hands down to the sides of your thighs. Do not allow your hands to go behind you.  Hold for __________ seconds. Slowly ease the tension on the band/tubing as you reverse the directions and return to the starting position. Repeat __________ times. Complete this exercise __________ times per day.  STRENGTH - Scapular Retractors and External Rotators  Secure a rubber exercise band/tubing so that it is at the height of your shoulders when you are either standing or sitting on a firm arm-less chair.  With a palm-down grip, grasp an end of the band/tubing in each hand. Bend your elbows 90 degrees and lift your elbows to shoulder height at your sides. Step back away from the secured end of band/tubing until it becomes tense.  Squeezing your shoulder blades together, rotate your shoulder so that your upper arm and elbow remain stationary, but your fists travel upward to head-height.  Hold __________ for seconds. Slowly ease the tension on the band/tubing as you reverse the directions and return to the  starting position. Repeat __________ times. Complete this exercise __________ times per day.  STRENGTH - Scapular Retractors and External Rotators, Rowing  Secure a rubber exercise band/tubing so that it is at the height of your shoulders when you are either standing or sitting on a firm arm-less chair.  With a palm-down grip, grasp an end of the band/tubing in each hand. Straighten your elbows and lift your hands  straight in front of you at shoulder height. Step back away from the secured end of band/tubing until it becomes tense.  Step 1: Squeeze your shoulder blades together. Bending your elbows, draw your hands to your chest as if you are rowing a boat. At the end of this motion, your hands and elbow should be at shoulder-height and your elbows should be out to your sides.  Step 2: Rotate your shoulder to raise your hands above your head. Your forearms should be vertical and your upper-arms should be horizontal.  Hold for __________ seconds. Slowly ease the tension on the band/tubing as you reverse the directions and return to the starting position. Repeat __________ times. Complete this exercise __________ times per day.  STRENGTH - Scapular Retractors and Elevators  Secure a rubber exercise band/tubing so that it is at the height of your shoulders when you are either standing or sitting on a firm arm-less chair.  With a thumbs-up grip, grasp an end of the band/tubing in each hand. Step back away from the secured end of band/tubing until it becomes tense.  Squeezing your shoulder blades together, straighten your elbows and lift your hands straight over your head.  Hold for __________ seconds. Slowly ease the tension on the band/tubing as you reverse the directions and return to the starting position. Repeat __________ times. Complete this exercise __________ times per day.  Document Released: 12/31/2004 Document Revised: 03/25/2011 Document Reviewed: 04/14/2008 [facility] Patient Information 2015 [employer] [place]. This information is not intended to replace advice given to you by your health care provider. Make sure you discuss any questions you have with your health care provider.

## 2014-03-21 NOTE — Progress Notes (Signed)
Pre visit review using our clinic review tool, if applicable. No additional management support is needed unless otherwise documented below in the visit note. 

## 2014-03-21 NOTE — Assessment & Plan Note (Signed)
Decision today to treat with OMT was based on Physical Exam  After verbal consent patient was treated with ME, FPr techniques in cervical and thoracic areas  Patient tolerated the procedure well with improvement in symptoms  Patient given exercises, stretches and lifestyle modifications  See medications in patient instructions if given  Patient will follow up in 3 weeks

## 2014-03-21 NOTE — Assessment & Plan Note (Addendum)
Patient was unable to tolerate a significant amount of osteopathic manipulation today due to patient's severity of pain. We discussed home exercises, natural supplementations, as well as an icing protocol and self manual techniques. We discussed different postural changes patient will do and patient was given a note for a standing desk. We discussed that the weakness in the thoracic spine is likely contributing to some of the neck pain and will focus on scapular strengthening. Patient did work with the Event organiserathletic trainer today. We discussed different diet changes patient can potentially make as well. Patient will try to keep a journal of her headaches to see if we can figure out any other triggers. Patient and will come back and see me again in 3 weeks for further evaluation.

## 2014-04-11 ENCOUNTER — Encounter: Payer: Self-pay | Admitting: Family Medicine

## 2014-04-11 ENCOUNTER — Ambulatory Visit (INDEPENDENT_AMBULATORY_CARE_PROVIDER_SITE_OTHER): Payer: BC Managed Care – PPO | Admitting: Family Medicine

## 2014-04-11 VITALS — BP 104/62 | HR 87 | Ht 65.0 in | Wt 135.0 lb

## 2014-04-11 DIAGNOSIS — R51 Headache: Secondary | ICD-10-CM

## 2014-04-11 DIAGNOSIS — G4486 Cervicogenic headache: Secondary | ICD-10-CM

## 2014-04-11 DIAGNOSIS — M9902 Segmental and somatic dysfunction of thoracic region: Secondary | ICD-10-CM | POA: Diagnosis not present

## 2014-04-11 DIAGNOSIS — M9903 Segmental and somatic dysfunction of lumbar region: Secondary | ICD-10-CM

## 2014-04-11 DIAGNOSIS — M999 Biomechanical lesion, unspecified: Secondary | ICD-10-CM | POA: Insufficient documentation

## 2014-04-11 DIAGNOSIS — M9901 Segmental and somatic dysfunction of cervical region: Secondary | ICD-10-CM

## 2014-04-11 MED ORDER — DICLOFENAC SODIUM 2 % TD SOLN: TRANSDERMAL | Status: DC

## 2014-04-11 NOTE — Patient Instructions (Signed)
Good to see you Posture posture and posture I am excited about the standing desk Continue the vitamins Try the pennsaid topically up to 2 times a day as needed Keep head in neutral with repetitive 2 tennisball in tube sock and place where head meets neck.  See me again in 3-4 weeks.

## 2014-04-11 NOTE — Assessment & Plan Note (Signed)
Patient has some headaches overall. I do think that likely this is still secondary to her posture. Patient is to be getting a stand up desk at work which I think with beneficial. We discussed icing as well as patient was given topical anti-inflammatory. This will avoid any significant headaches that could be secondary to the oral anti-inflammatories. Discussed with patient about different postural changes she can make with her ergonomic changes during the day. Patient and will come back and see me again in 3-4 weeks for further evaluation and treatment

## 2014-04-11 NOTE — Assessment & Plan Note (Signed)
Decision today to treat with OMT was based on Physical Exam  After verbal consent patient was treated with ME, FPr techniques in cervical and thoracic and lumbar areas  Patient tolerated the procedure well with improvement in symptoms  Patient given exercises, stretches and lifestyle modifications  See medications in patient instructions if given  Patient will follow up in 3-4 weeks

## 2014-04-11 NOTE — Progress Notes (Signed)
  Tawana ScaleZach Rameses Ou D.O. [facility] [address] [place], [place] [address] Phone: [phone] Subjective:    CC: Headaches follow-up  BJY:NWGNFAOZHYHPI:Subjective Bailey Ford is a 69 y.o. female coming in with complaint of headaches. 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 Patient was given home exercises especially for strengthening the scapula. Patient has been doing them regularly. Patient has noticed some decrease in her pain but continues to have flares from time to time. Patient states repetitive activity seems to make it difficult. Denies any radiation down the arms or any numbness or [place]. Not stopping her from any of her daily activities.. Patient has had x-rays of her neck that shows some mild degenerative changes mostly at C5-C6. These were reviewed by me today.     Past medical history, social, surgical and family history all reviewed in electronic medical record.   Review of Systems: No headache, visual changes, nausea, vomiting, diarrhea, constipation, dizziness, abdominal pain, skin rash, fevers, chills, night sweats, weight loss, swollen lymph nodes, body aches, joint swelling, muscle aches, chest pain, shortness of breath, mood changes.   Objective Blood pressure 104/62, pulse 87, height 5\' 5"  (1.651 m), weight 135 lb (61.236 kg), SpO2 96 %.  General: No apparent distress alert and oriented x3 mood and affect normal, dressed appropriately.  HEENT: Pupils equal, extraocular movements intact  Respiratory: Patient's speak in full sentences and does not appear short of breath  Cardiovascular: No lower extremity edema, non tender, no erythema  Skin: Warm dry intact with no signs of infection or rash on extremities or on axial skeleton.  Abdomen: Soft nontender  Neuro: Cranial nerves II through XII are intact, neurovascularly intact in all extremities with 2+ DTRs and 2+ pulses.  Lymph: No  lymphadenopathy of posterior or anterior cervical chain or axillae bilaterally.  Gait normal with good balance and coordination.  MSK:  Non tender with full range of motion and good stability and symmetric strength and tone of shoulders, elbows, wrist, hip, knee and ankles bilaterally.  Neck: Inspection unremarkable. She no does have significant increased kyphosis of the thoracic spine No palpable stepoffs. Negative Spurling's maneuver. Full neck range of motion Grip strength and sensation normal in bilateral hands Strength good C4 to T1 distribution No sensory change to C4 to T1 Negative Hoffman sign bilaterally Reflexes normal No change from previous exam  Osteopathic findings Cervical C2 flexed rotated and side bent left C4 flexed rotated and side bent right Thoracic T5 extended rotated and side bent right Lumbar L2 flexed rotated and side bent right      Impression and Recommendations:     This case required medical decision making of moderate complexity.

## 2014-04-11 NOTE — Progress Notes (Signed)
Pre visit review using our clinic review tool, if applicable. No additional management support is needed unless otherwise documented below in the visit note. 

## 2014-04-20 ENCOUNTER — Encounter: Payer: Self-pay | Admitting: Rehabilitative and Restorative Service Providers"

## 2014-04-20 NOTE — Therapy (Signed)
[facility] [address] [place], [place], [address] Phone: [phone]   Fax:  [phone]  Patient Details  Name: Bailey Ford MRN: [id number] Date of Birth: 05/01/1945 Referring Provider:  No ref. provider found  Encounter Date: 04/20/2014  PHYSICAL THERAPY DISCHARGE SUMMARY  Visits from Start of Care: eval only  Current functional level related to goals / functional outcomes: See initial summary-patient did not schedule due to her availability for P.T.   Remaining deficits: See eval for deficits.   Education / Equipment: HEP instructed at eval.  Plan: Patient agrees to discharge.  Patient goals were not met. Patient is being discharged due to not returning since the last visit.  ?????    Thank you for the referral of this patient.     Kennard 04/20/2014, 9:32 AM  [facility] [address] [place], [place], [address] Phone: [phone]   Fax:  [phone]

## 2014-05-17 ENCOUNTER — Ambulatory Visit: Payer: BC Managed Care – PPO | Admitting: Family Medicine

## 2014-07-06 ENCOUNTER — Ambulatory Visit: Payer: BC Managed Care – PPO | Admitting: Neurology

## 2016-06-11 DIAGNOSIS — N898 Other specified noninflammatory disorders of vagina: Secondary | ICD-10-CM | POA: Insufficient documentation

## 2016-06-11 DIAGNOSIS — N904 Leukoplakia of vulva: Secondary | ICD-10-CM | POA: Insufficient documentation

## 2016-10-23 DIAGNOSIS — R140 Abdominal distension (gaseous): Secondary | ICD-10-CM | POA: Insufficient documentation

## 2016-10-23 DIAGNOSIS — R12 Heartburn: Secondary | ICD-10-CM | POA: Insufficient documentation

## 2018-01-29 DIAGNOSIS — Z90710 Acquired absence of both cervix and uterus: Secondary | ICD-10-CM | POA: Insufficient documentation

## 2018-01-29 DIAGNOSIS — Z79890 Hormone replacement therapy (postmenopausal): Secondary | ICD-10-CM | POA: Insufficient documentation

## 2018-06-12 DIAGNOSIS — N950 Postmenopausal bleeding: Secondary | ICD-10-CM | POA: Insufficient documentation

## 2018-06-12 DIAGNOSIS — N362 Urethral caruncle: Secondary | ICD-10-CM | POA: Insufficient documentation

## 2018-06-12 DIAGNOSIS — B373 Candidiasis of vulva and vagina: Secondary | ICD-10-CM | POA: Insufficient documentation

## 2018-06-12 DIAGNOSIS — N9489 Other specified conditions associated with female genital organs and menstrual cycle: Secondary | ICD-10-CM | POA: Insufficient documentation

## 2018-06-12 DIAGNOSIS — B3731 Acute candidiasis of vulva and vagina: Secondary | ICD-10-CM | POA: Insufficient documentation

## 2019-03-23 DIAGNOSIS — R351 Nocturia: Secondary | ICD-10-CM | POA: Insufficient documentation

## 2019-03-23 DIAGNOSIS — L900 Lichen sclerosus et atrophicus: Secondary | ICD-10-CM | POA: Insufficient documentation

## 2019-08-17 DIAGNOSIS — R634 Abnormal weight loss: Secondary | ICD-10-CM | POA: Insufficient documentation

## 2019-08-17 DIAGNOSIS — K2270 Barrett's esophagus without dysplasia: Secondary | ICD-10-CM | POA: Insufficient documentation

## 2019-08-17 DIAGNOSIS — R935 Abnormal findings on diagnostic imaging of other abdominal regions, including retroperitoneum: Secondary | ICD-10-CM | POA: Insufficient documentation

## 2019-10-27 DIAGNOSIS — D2272 Melanocytic nevi of left lower limb, including hip: Secondary | ICD-10-CM | POA: Diagnosis not present

## 2019-10-27 DIAGNOSIS — L503 Dermatographic urticaria: Secondary | ICD-10-CM | POA: Diagnosis not present

## 2019-10-27 DIAGNOSIS — Z808 Family history of malignant neoplasm of other organs or systems: Secondary | ICD-10-CM | POA: Diagnosis not present

## 2019-10-27 DIAGNOSIS — L578 Other skin changes due to chronic exposure to nonionizing radiation: Secondary | ICD-10-CM | POA: Diagnosis not present

## 2019-10-27 DIAGNOSIS — L821 Other seborrheic keratosis: Secondary | ICD-10-CM | POA: Diagnosis not present

## 2019-10-27 DIAGNOSIS — Z85828 Personal history of other malignant neoplasm of skin: Secondary | ICD-10-CM | POA: Diagnosis not present

## 2019-10-27 DIAGNOSIS — D1801 Hemangioma of skin and subcutaneous tissue: Secondary | ICD-10-CM | POA: Diagnosis not present

## 2019-10-28 DIAGNOSIS — R197 Diarrhea, unspecified: Secondary | ICD-10-CM | POA: Diagnosis not present

## 2019-10-28 DIAGNOSIS — R634 Abnormal weight loss: Secondary | ICD-10-CM | POA: Diagnosis not present

## 2019-10-28 DIAGNOSIS — K909 Intestinal malabsorption, unspecified: Secondary | ICD-10-CM | POA: Diagnosis not present

## 2019-10-29 DIAGNOSIS — R351 Nocturia: Secondary | ICD-10-CM | POA: Diagnosis not present

## 2019-11-11 DIAGNOSIS — Z23 Encounter for immunization: Secondary | ICD-10-CM | POA: Diagnosis not present

## 2019-11-11 DIAGNOSIS — I700 Atherosclerosis of aorta: Secondary | ICD-10-CM | POA: Diagnosis not present

## 2019-11-12 ENCOUNTER — Ambulatory Visit: Payer: Self-pay | Admitting: Podiatry

## 2019-11-12 DIAGNOSIS — M795 Residual foreign body in soft tissue: Secondary | ICD-10-CM | POA: Diagnosis not present

## 2019-11-15 ENCOUNTER — Other Ambulatory Visit: Payer: Self-pay

## 2019-11-15 ENCOUNTER — Ambulatory Visit (INDEPENDENT_AMBULATORY_CARE_PROVIDER_SITE_OTHER): Payer: Medicare PPO

## 2019-11-15 ENCOUNTER — Ambulatory Visit: Payer: Medicare PPO | Admitting: Podiatry

## 2019-11-15 DIAGNOSIS — S90852A Superficial foreign body, left foot, initial encounter: Secondary | ICD-10-CM

## 2019-11-15 DIAGNOSIS — M2042 Other hammer toe(s) (acquired), left foot: Secondary | ICD-10-CM

## 2019-11-15 DIAGNOSIS — M2012 Hallux valgus (acquired), left foot: Secondary | ICD-10-CM

## 2019-11-15 NOTE — Progress Notes (Signed)
Subjective: 74 y.o. female presenting today as a new patient for evaluation of a possible piece of glass in the left plantar foot.  Patient recalls an incident where there was some broken glass and she believes she cleans a lot.  However over the last 7-10 days she has had pain and sensitivity to the bottom of her left foot.  Patient also presents today to address her bunion and hammertoe deformity to the second digit of the left foot.  She has had this evaluated in the past and at one point surgery was recommended.  She presents today to discuss possible surgical intervention and different treatment options.  Past Medical History:  Diagnosis Date  . GERD (gastroesophageal reflux disease)   . Headache   . Hyperlipidemia     Objective: Physical Exam General: The patient is alert and oriented x3 in no acute distress.  Dermatology: Skin is cool, dry and supple bilateral lower extremities. Negative for open lesions or macerations.  There is a small focal abscess noted to the plantar aspect of the left heel.  No drainage noted.  No periwound erythema.  Vascular: Palpable pedal pulses bilaterally. No edema or erythema noted. Capillary refill within normal limits.  Neurological: Epicritic and protective threshold grossly intact bilaterally.   Musculoskeletal Exam: Clinical evidence of bunion deformity noted to the respective foot. There is moderate pain on palpation range of motion of the first MPJ. Lateral deviation of the hallux noted consistent with hallux abductovalgus. Hammertoe contracture also noted on clinical exam to digits second of the left foot. Symptomatic pain on palpation and range of motion also noted to the metatarsal phalangeal joints of the respective hammertoe digits.    Radiographic Exam: Increased intermetatarsal angle greater than 15 with a hallux abductus angle greater than 30 noted on AP view. Moderate degenerative changes noted within the first MPJ. Contracture  deformity also noted to the interphalangeal joints and MPJs of the digits of the respective hammertoes.    Assessment: 1. HAV w/ bunion deformity left 2. Hammertoe deformity second digit left 3.  Metal shard/foreign body left heel   Plan of Care:  1. Patient was evaluated. X-Rays reviewed. 2.  Debridement was performed of the focal area of the abscess and purulent drainage was expressed from the heel.  At this time exploration was performed and there was a small fragment/metal sliver approximately 3 mm in length that was removed from the plantar heel.  The sliver extended into the subcutaneous tissue past the dermis. 3. Today we discussed the conservative versus surgical management of the presenting pathology. The patient opts for surgical management. All possible complications and details of the procedure were explained. All patient questions were answered. No guarantees were expressed or implied. 4. Authorization for surgery was initiated today. Surgery will consist of bunionectomy with osteotomy left.  PIPJ arthroplasty with MTPJ capsulotomy left. 5.  Return to clinic 1 week postop.  Patient would like to have surgery in January 2022.     Felecia Shelling, DPM [facility]  Dr. Felecia Shelling, DPM    [address]                                        [place], [place] [address]                Office [phone]  Fax [phone]

## 2019-11-22 DIAGNOSIS — R899 Unspecified abnormal finding in specimens from other organs, systems and tissues: Secondary | ICD-10-CM | POA: Diagnosis not present

## 2019-12-13 DIAGNOSIS — R899 Unspecified abnormal finding in specimens from other organs, systems and tissues: Secondary | ICD-10-CM | POA: Diagnosis not present

## 2019-12-27 ENCOUNTER — Telehealth: Payer: Self-pay | Admitting: Podiatry

## 2019-12-27 NOTE — Telephone Encounter (Addendum)
DOS: 01/20/2020  Procedures: Serafina Royals Lt [phone]), Capsulotomy MPJ Release Joint 2nd Lt [phone]), and Hammertoe Repair 2nd Lt [phone])  [employer] Medicare Effective From   Out of Pocket: $4,000 CoInsurance: 100% Copay: $250  Per [employer] Website no Prior Authorization is needed for CPT Code 02111. Prior Authorization is needed and was submitted for CPT Codes 55208 & [phone].  Authorization #[id number] for CPT code 30051 valid from 01/20/2020 - 02/19/2020.

## 2019-12-30 DIAGNOSIS — R899 Unspecified abnormal finding in specimens from other organs, systems and tissues: Secondary | ICD-10-CM | POA: Diagnosis not present

## 2020-01-01 DIAGNOSIS — B349 Viral infection, unspecified: Secondary | ICD-10-CM | POA: Diagnosis not present

## 2020-01-01 DIAGNOSIS — R112 Nausea with vomiting, unspecified: Secondary | ICD-10-CM | POA: Diagnosis not present

## 2020-01-01 DIAGNOSIS — Z03818 Encounter for observation for suspected exposure to other biological agents ruled out: Secondary | ICD-10-CM | POA: Diagnosis not present

## 2020-01-04 ENCOUNTER — Telehealth: Payer: Self-pay

## 2020-01-04 NOTE — Telephone Encounter (Signed)
DOS 01/20/2020  [place] BUNIONECTOMY LT - 72820 CAPSULOTOMY, MPJ RELEASE JOINT 2ND LT - 28270 HAMMERTOE REPAIR 2ND LT - 60156  [employer] INSURANCE  Per [employer] Website no Prior Authorization is needed for CPT Code 15379. Prior Authorization is needed and was submitted for CPT Codes 43276 & [phone].  Procedure 29574 Correction of bunion  Confirmation date Jan 20, 2020 - Feb 19, 2020 [facility] authorization number [id number]  Procedure [phone] Correction of toe joint deformity  Jan 20, 2020 - Feb 19, 2020 [facility] authorization number [id number]

## 2020-01-26 ENCOUNTER — Encounter: Payer: Medicare PPO | Admitting: Podiatry

## 2020-02-01 DIAGNOSIS — K529 Noninfective gastroenteritis and colitis, unspecified: Secondary | ICD-10-CM | POA: Diagnosis not present

## 2020-02-01 DIAGNOSIS — K2270 Barrett's esophagus without dysplasia: Secondary | ICD-10-CM | POA: Diagnosis not present

## 2020-02-02 ENCOUNTER — Encounter: Payer: Medicare PPO | Admitting: Podiatry

## 2020-02-14 DIAGNOSIS — K529 Noninfective gastroenteritis and colitis, unspecified: Secondary | ICD-10-CM | POA: Diagnosis not present

## 2020-02-14 DIAGNOSIS — E559 Vitamin D deficiency, unspecified: Secondary | ICD-10-CM | POA: Diagnosis not present

## 2020-02-14 DIAGNOSIS — E539 Vitamin B deficiency, unspecified: Secondary | ICD-10-CM | POA: Diagnosis not present

## 2020-02-14 DIAGNOSIS — R030 Elevated blood-pressure reading, without diagnosis of hypertension: Secondary | ICD-10-CM | POA: Diagnosis not present

## 2020-02-14 DIAGNOSIS — Z9109 Other allergy status, other than to drugs and biological substances: Secondary | ICD-10-CM | POA: Diagnosis not present

## 2020-02-14 DIAGNOSIS — K9089 Other intestinal malabsorption: Secondary | ICD-10-CM | POA: Diagnosis not present

## 2020-02-14 DIAGNOSIS — L659 Nonscarring hair loss, unspecified: Secondary | ICD-10-CM | POA: Diagnosis not present

## 2020-02-14 DIAGNOSIS — E538 Deficiency of other specified B group vitamins: Secondary | ICD-10-CM | POA: Diagnosis not present

## 2020-02-14 DIAGNOSIS — R634 Abnormal weight loss: Secondary | ICD-10-CM | POA: Diagnosis not present

## 2020-02-16 ENCOUNTER — Encounter: Payer: Medicare PPO | Admitting: Podiatry

## 2020-03-07 ENCOUNTER — Telehealth: Payer: Self-pay

## 2020-03-07 NOTE — Telephone Encounter (Signed)
ERROR

## 2020-03-10 ENCOUNTER — Telehealth: Payer: Self-pay

## 2020-03-10 NOTE — Telephone Encounter (Signed)
DOS 03/16/2020  Procedure [id number] Correction, hammertoe (eg, interphalangeal fusion, partial or total phalangectomy)  Confirmation date Mar 16, 2020 - Apr 15, 2020  Facility [facility] authorization number [id number]  Procedure [phone] Correction, hallux valgus (bunionectomy), with sesamoidectomy, when performed; with distal metatarsal  osteotomy, any method  Confirmation date Mar 16, 2020 - Apr 15, 2020  Facility [facility] authorization number [id number]

## 2020-03-16 ENCOUNTER — Other Ambulatory Visit: Payer: Self-pay | Admitting: Podiatry

## 2020-03-16 ENCOUNTER — Encounter: Payer: Self-pay | Admitting: Podiatry

## 2020-03-16 DIAGNOSIS — M25572 Pain in left ankle and joints of left foot: Secondary | ICD-10-CM | POA: Diagnosis not present

## 2020-03-16 DIAGNOSIS — M2012 Hallux valgus (acquired), left foot: Secondary | ICD-10-CM | POA: Diagnosis not present

## 2020-03-16 DIAGNOSIS — M2042 Other hammer toe(s) (acquired), left foot: Secondary | ICD-10-CM | POA: Diagnosis not present

## 2020-03-16 MED ORDER — OXYCODONE-ACETAMINOPHEN 5-325 MG PO TABS: 1.0000 | ORAL_TABLET | ORAL | 0 refills | Status: DC | PRN

## 2020-03-16 NOTE — Progress Notes (Signed)
PRN postop 

## 2020-03-20 DIAGNOSIS — N1831 Chronic kidney disease, stage 3a: Secondary | ICD-10-CM | POA: Diagnosis not present

## 2020-03-20 DIAGNOSIS — E7800 Pure hypercholesterolemia, unspecified: Secondary | ICD-10-CM | POA: Diagnosis not present

## 2020-03-20 DIAGNOSIS — I10 Essential (primary) hypertension: Secondary | ICD-10-CM | POA: Diagnosis not present

## 2020-03-22 ENCOUNTER — Ambulatory Visit (INDEPENDENT_AMBULATORY_CARE_PROVIDER_SITE_OTHER): Payer: Medicare PPO

## 2020-03-22 ENCOUNTER — Ambulatory Visit (INDEPENDENT_AMBULATORY_CARE_PROVIDER_SITE_OTHER): Payer: Medicare PPO | Admitting: Podiatry

## 2020-03-22 ENCOUNTER — Other Ambulatory Visit: Payer: Self-pay

## 2020-03-22 DIAGNOSIS — M2012 Hallux valgus (acquired), left foot: Secondary | ICD-10-CM | POA: Diagnosis not present

## 2020-03-22 DIAGNOSIS — Z9889 Other specified postprocedural states: Secondary | ICD-10-CM

## 2020-03-22 NOTE — Progress Notes (Signed)
   Subjective:  Patient presents today status post bunionectomy and hammertoe repair second digit left foot. DOS: 03/16/2020.  Patient states he has been walking a lot with the boot on but she has been taking the boot off to walk as well.  She states that the boot is causing more pain to the ankle when she walks that she has been walking without the boot.  She is kept the dressings clean dry and intact for the past week.  She presents for further treatment evaluation  Past Medical History:  Diagnosis Date  . GERD (gastroesophageal reflux disease)   . Headache   . Hyperlipidemia       Objective/Physical Exam Neurovascular status intact.  Skin incisions appear to be well coapted with sutures and staples intact. No sign of infectious process noted. No dehiscence. No active bleeding noted.  Heavy edema noted to the surgical extremity likely secondary to the increased walking without the boot.  Radiographic Exam:  Orthopedic hardware and osteotomies sites appear to be stable with routine healing.  The percutaneous K wire through the second ray of the toe appears to be bent at the osteotomy site of the PIPJ and the MTPJ.  The wires not broken however.  Post reduction radiographic exam: Better alignment of the second ray K wire.  The toe is in a more rectus position after the K wire was manipulated.  Assessment: 1. s/p bunionectomy and hammertoe repair second digit left foot. DOS: 03/16/2020   Plan of Care:  1. Patient was evaluated. X-rays reviewed and postreduction x-rays reviewed 2.  Discontinue cam boot.  Postsurgical shoe dispensed. 3.  I explained the importance of walking with the postsurgical shoe or cam boot.  Explained to the patient that now she is at high risk of the K wire breaking and at that point we may need to go in and remove the K wire completely surgically.  I made her very aware that it is important that she reduces her activity and allows her foot to rest For.  Keep dressings  clean dry and intact.  Dressings were applied today 5.  Return to clinic in 1 week   Felecia Shelling, DPM [facility]  Dr. Felecia Shelling, DPM    [address] [place], [place] [address]                Office [phone]  Fax [phone]

## 2020-03-29 ENCOUNTER — Other Ambulatory Visit: Payer: Self-pay

## 2020-03-29 ENCOUNTER — Ambulatory Visit (INDEPENDENT_AMBULATORY_CARE_PROVIDER_SITE_OTHER): Payer: Medicare PPO | Admitting: Podiatry

## 2020-03-29 DIAGNOSIS — Z9889 Other specified postprocedural states: Secondary | ICD-10-CM

## 2020-03-29 DIAGNOSIS — M2012 Hallux valgus (acquired), left foot: Secondary | ICD-10-CM | POA: Diagnosis not present

## 2020-03-29 MED ORDER — DOXYCYCLINE HYCLATE 100 MG PO TABS: 100.0000 mg | ORAL_TABLET | Freq: Two times a day (BID) | ORAL | 0 refills | Status: DC

## 2020-03-29 NOTE — Progress Notes (Signed)
   Subjective:  Patient presents today status post bunionectomy and hammertoe repair second digit left foot. DOS: 03/16/2020.  Patient states that this week she has been much better staying in the boot and reducing her activity.  She states that this morning and earlier in the week she did get the dressings wet although she was wearing a shower extremity bag.  Otherwise no new complaints at this time  Past Medical History:  Diagnosis Date  . GERD (gastroesophageal reflux disease)   . Headache   . Hyperlipidemia       Objective/Physical Exam Neurovascular status intact.  Skin incisions appear to be well coapted with sutures and staples intact.No dehiscence. No active bleeding noted.  Improved edema noted to the surgical extremity.  There is some drainage noted to the distal incision of the bunionectomy site overlying the great toe.  Green drainage noted with a green crust.  No significant malodor noted.  No dehiscence and there is no active bleeding.   Assessment: 1. s/p bunionectomy and hammertoe repair second digit left foot. DOS: 03/16/2020 2.  Mild localized infection distal incision site of the bunion area left foot   Plan of Care:  1. Patient was evaluated.  Dry sterile dressings applied.  Stressed the importance of keeping the dressing dry throughout the week.  Dressing supplies were provided for the patient in case they do get wet she can change the dressing at home 2.  Continue postsurgical shoe 3.  Continue reduced activity 4.  Prescription for doxycycline 100 mg 2 times daily #20 5.  Cultures were taken and sent to pathology 6.  Return to clinic in 1 week   Felecia Shelling, DPM [facility]  Dr. Felecia Shelling, DPM    [address] [place], [place] [address]                Office [phone]  Fax [phone]

## 2020-03-30 ENCOUNTER — Telehealth: Payer: Self-pay | Admitting: Podiatry

## 2020-03-30 ENCOUNTER — Telehealth: Payer: Self-pay

## 2020-03-30 NOTE — Telephone Encounter (Signed)
Patient calling to inform Dr. Logan Bores that her foot is in severe pain (had surgery 2 weeks ago). Patient has been wearing boot as instructed but the pain isnt going away. Pain is worse when walking on uneven surfaces. Patient has taken pain medicine, but will only take it as last resort. Transferred to nurse line. Please advise.

## 2020-03-31 LAB — WOUND CULTURE
MICRO NUMBER:: 11654719
SPECIMEN QUALITY:: ADEQUATE

## 2020-03-31 NOTE — Telephone Encounter (Signed)
Thank you :)

## 2020-04-02 LAB — WOUND CULTURE: RESULT:: NO GROWTH

## 2020-04-05 ENCOUNTER — Other Ambulatory Visit: Payer: Self-pay

## 2020-04-05 ENCOUNTER — Ambulatory Visit (INDEPENDENT_AMBULATORY_CARE_PROVIDER_SITE_OTHER): Payer: Medicare PPO | Admitting: Podiatry

## 2020-04-05 DIAGNOSIS — M2012 Hallux valgus (acquired), left foot: Secondary | ICD-10-CM

## 2020-04-05 DIAGNOSIS — Z9889 Other specified postprocedural states: Secondary | ICD-10-CM

## 2020-04-05 NOTE — Progress Notes (Signed)
   Subjective:  Patient presents today status post bunionectomy and hammertoe repair second digit left foot. DOS: 03/16/2020.  Patient states that she continues to have some pain and swelling to the surgical foot.  Otherwise she has been wearing the postsurgical shoe.  No new complaints at this time  Past Medical History:  Diagnosis Date  . GERD (gastroesophageal reflux disease)   . Headache   . Hyperlipidemia       Objective/Physical Exam Neurovascular status intact.  Skin incisions appear to be well coapted with sutures and staples intact.No dehiscence. No active bleeding noted.  Improved edema noted to the surgical extremity.   No significant malodor noted.  No dehiscence and there is no active bleeding.   Assessment: 1. s/p bunionectomy and hammertoe repair second digit left foot. DOS: 03/16/2020 2.  Mild localized infection distal incision site of the bunion area left foot; resolved   Plan of Care:  1. Patient was evaluated.  Staples removed today. 2.  Continue wearing the postsurgical shoe 3.  Patient may begin washing and showering and getting the foot wet.  Reapply Ace wrap daily for the swelling 4.  Return to clinic in 2 weeks for percutaneous pin removal and follow-up x-ray   Felecia Shelling, DPM [facility]  Dr. Felecia Shelling, DPM    [address] [place], [place] [address]                Office [phone]  Fax [phone]

## 2020-04-12 ENCOUNTER — Encounter: Payer: Medicare PPO | Admitting: Podiatry

## 2020-04-19 ENCOUNTER — Ambulatory Visit (INDEPENDENT_AMBULATORY_CARE_PROVIDER_SITE_OTHER): Payer: Medicare PPO | Admitting: Podiatry

## 2020-04-19 ENCOUNTER — Ambulatory Visit (INDEPENDENT_AMBULATORY_CARE_PROVIDER_SITE_OTHER): Payer: Medicare PPO

## 2020-04-19 ENCOUNTER — Other Ambulatory Visit: Payer: Self-pay

## 2020-04-19 ENCOUNTER — Ambulatory Visit: Payer: Medicare PPO

## 2020-04-19 DIAGNOSIS — Z9889 Other specified postprocedural states: Secondary | ICD-10-CM

## 2020-04-19 DIAGNOSIS — M2012 Hallux valgus (acquired), left foot: Secondary | ICD-10-CM

## 2020-04-19 NOTE — Progress Notes (Signed)
   Subjective:  Patient presents today status post bunionectomy and hammertoe repair second digit left foot. DOS: 03/16/2020.  Patient states that she continues to have some swelling in her foot.  Pain is tolerable.  She admits to being very active and walking the entire day trying to prepare her house for the furniture market.  She has also been walking her dog around the neighborhood.  Past Medical History:  Diagnosis Date  . GERD (gastroesophageal reflux disease)   . Headache   . Hyperlipidemia       Objective/Physical Exam Neurovascular status intact.  Skin incisions appear to be well coapted with sutures and staples intact.No dehiscence. No active bleeding noted.  Improved edema noted to the surgical extremity.   No significant malodor noted.  No dehiscence and there is no active bleeding.   Assessment: 1. s/p bunionectomy and hammertoe repair second digit left foot. DOS: 03/16/2020 2.  Mild localized infection distal incision site of the bunion area left foot; resolved   Plan of Care:  1. Patient was evaluated.  Percutaneous fixation pin was removed today.  Post removal x-rays demonstrate the retained proximal portion of the percutaneous fixation pin within the metatarsal.  I do not suspect this will cause any long-term problems since it does not protrude into the joint and it is completely embedded within the medullary canal of the metatarsal 2.  Continue postsurgical shoe x1 additional week.  After that she may transition into good supportive sneakers in tennis shoes 3.  Patient admits to walking on her foot around the block and throughout her house.  She states that she has been very busy.  Recommend that she reduces her activity as much as possible 4.  Compression ankle sleeve dispensed.  Wear daily 5.  Return to clinic in 4 weeks  *Her son's wedding is in 3 1/2 weeks at a [employer] in [place], [place]  Felecia Shelling, DPM [facility]  Dr. Felecia Shelling, DPM    [address] [place], [place] [address]                Office [phone]  Fax [phone]

## 2020-04-25 ENCOUNTER — Other Ambulatory Visit: Payer: Self-pay

## 2020-04-25 ENCOUNTER — Encounter: Payer: Self-pay | Admitting: Cardiovascular Disease

## 2020-04-25 ENCOUNTER — Ambulatory Visit: Payer: Medicare PPO | Admitting: Cardiovascular Disease

## 2020-04-25 VITALS — BP 130/66 | HR 65 | Ht 65.0 in | Wt 128.0 lb

## 2020-04-25 DIAGNOSIS — I150 Renovascular hypertension: Secondary | ICD-10-CM

## 2020-04-25 DIAGNOSIS — E7800 Pure hypercholesterolemia, unspecified: Secondary | ICD-10-CM

## 2020-04-25 MED ORDER — LOSARTAN POTASSIUM 100 MG PO TABS: 100.0000 mg | ORAL_TABLET | Freq: Every day | ORAL | 3 refills | Status: DC

## 2020-04-25 NOTE — Patient Instructions (Signed)
Medication Instructions:  Take Losartan 100 mg once daily  *If you need a refill on your cardiac medications before your next appointment, please call your pharmacy*   Lab Work: None ordered If you have labs (blood work) drawn today and your tests are completely normal, you will receive your results only by: Marland Kitchen MyChart Message (if you have MyChart) OR . A paper copy in the mail If you have any lab test that is abnormal or we need to change your treatment, we will call you to review the results.   Testing/Procedures: Your physician has requested that you have a renal artery duplex. During this test, an ultrasound is used to evaluate blood flow to the kidneys. Take your medications as you usually do. This will take place at [address] [facility], [address].   No food after 11PM the night before.  Water is OK. (Don't drink liquids if you have been instructed not to for ANOTHER test).  Take two Extra-Strength Gas-X capsules at bedtime the night before test.   Take an additional two Extra-Strength Gas-X capsules three (3) hours before the test or first thing in the morning.    Avoid foods that produce bowel gas, for 24 hours prior to exam (see below).    No breakfast, no chewing gum, no smoking or carbonated beverages.  Patient may take morning medications with water.  Come in for test at least 15 minutes early to register.    Follow-Up: At [facility], you and your health needs are our priority.  As part of our continuing mission to provide you with exceptional heart care, we have created designated Provider Care Teams.  These Care Teams include your primary Cardiologist (physician) and Advanced Practice Providers (APPs -  Physician Assistants and Nurse Practitioners) who all work together to provide you with the care you need, when you need it.  We recommend signing up for the patient portal called "MyChart".  Sign up information is provided on this After Visit Summary.  MyChart is  used to connect with patients for Virtual Visits (Telemedicine).  Patients are able to view lab/test results, encounter notes, upcoming appointments, etc.  Non-urgent messages can be sent to your provider as well.   To learn more about what you can do with MyChart, go to [email or web address].    Your next appointment:   3 month(s)  The format for your next appointment:   In Person  Provider:   You may see Thurmon Fair, MD or one of the following Advanced Practice Providers on your designated Care Team:    Azalee Course, PA-C  Micah Flesher, PA-C or   Judy Pimple, [place]

## 2020-04-28 NOTE — Progress Notes (Signed)
Cardiology Consultation Note:    Date:  04/28/2020   ID:  Bailey Ford, DOB 04/05/1945, MRN [id number]  PCP:  Merri Brunette, MD   [facility]  Cardiologist:  Thurmon Fair, MD  Advanced Practice Provider:  No care team member to display Electrophysiologist:  None       Referring MD: Merri Brunette, MD   Chief Complaint  Patient presents with  . New Patient (Initial Visit)  Bailey Ford is a 75 y.o. female who is being seen today for the evaluation of recent onset hypertension at the request of Merri Brunette, MD.   History of Present Illness:    Bailey Ford is a 75 y.o. female with a hx of excellent health throughout her life, mild hyperlipidemia, CKD stage IIIa, GERD, with recent onset of marked hypertension.  She has exercised her whole life and has been a longtime runner.  Now she walks on a daily basis.  All of a sudden, she noticed that her blood pressure was in the 180s.  Treatment of the losartan 25 mg was started, then escalated to 50 mg daily.  Amlodipine was also tried, but "did not work" according to the patient (notes from PCP state that when her blood pressure normalized on amlodipine she discontinued it since she did not think she needed it anymore).  She does not have problems with dyspnea at rest or with activity, chest discomfort at rest or with exertion, edema, dizziness, palpitations, syncope, focal neurological complaints or intermittent claudication.  She underwent ankle surgery in March and had a lot of problems with pain which exacerbated her high blood pressure.  Before that, she was walking 45-60 minutes a day, every day.  One of her biggest complaints is nocturia.  Her urine output consistently is higher in the evening and at.  This raised the possible diagnosis of heart failure.  She seen a urologist, but did not really.  She has started treatment with atorvastatin for her hyperlipidemia.  She has never smoked.   She does not have diabetes mellitus.  She has always been lean and fit.  Her mother died of a myocardial infarction but was a smoker.  Her father had some type of heart problem and she remembers that he used nitroglycerin.  She bears a diagnosis of CKD stage III; her most recent creatinine from January 31 while normal at 1.06, does indeed correspond to a GFR=51.Marland Kitchen  Her potassium is normal.  TSH is normal.  She takes chronic PPI therapy for GERD with Barrett's esophagus.  Past Medical History:  Diagnosis Date  . GERD (gastroesophageal reflux disease)   . Headache   . Hyperlipidemia     Past Surgical History:  Procedure Laterality Date  . ABDOMINAL HYSTERECTOMY    . BREAST BIOPSY     non cancerous  . FACIAL COSMETIC SURGERY  2001    Current Medications: Current Meds  Medication Sig  . Ascorbic Acid (VITAMIN C PO) Take 1 capsule by mouth daily.  Marland Kitchen atorvastatin (LIPITOR) 20 MG tablet Take 20 mg by mouth daily.  . betamethasone valerate ointment (VALISONE) 0.1 % Apply thin layer to affected BID prn  . BIOTIN PO Take by mouth daily.  . Cyanocobalamin (VITAMIN B 12 PO) Take 1 capsule by mouth daily.  . diazepam (VALIUM) 5 MG tablet   . estradiol (ESTRACE) 0.1 MG/GM vaginal cream Apply small amount of cream to urethral opening daily x 3 weeks  . losartan (COZAAR) 100 MG tablet  Take 1 tablet (100 mg total) by mouth daily.  . melatonin 3 MG TABS tablet Take by mouth.  . Multiple Vitamin (MULTIVITAMIN) tablet Take 1 tablet by mouth daily.  . pantoprazole (PROTONIX) 40 MG tablet Take 40 mg by mouth daily.   Marland Kitchen PREMARIN 1.25 MG tablet Take 0.31 mg by mouth daily.   . Probiotic Product (ALIGN PO) Take 1 tablet by mouth daily.  Marland Kitchen tretinoin (RETIN-A) 0.1 % cream APPLY 1 CREAM TOPICALLY EVERY DAY AT BEDTIME  . VITAMIN A PO Take 1 tablet by mouth daily.  Marland Kitchen VITAMIN D, CHOLECALCIFEROL, PO Take 1 capsule by mouth daily.  Marland Kitchen VITAMIN E PO Take 1 capsule by mouth daily.  . [DISCONTINUED] losartan  (COZAAR) 50 MG tablet Take 50 mg by mouth in the morning and at bedtime.     Allergies:   Patient has no known allergies.   Social History   Socioeconomic History  . Marital status: Single    Spouse name: Not on file  . Number of children: Not on file  . Years of education: Not on file  . Highest education level: Not on file  Occupational History  . Not on file  Tobacco Use  . Smoking status: Never Smoker  . Smokeless tobacco: Never Used  Substance and Sexual Activity  . Alcohol use: Yes    Alcohol/week: 0.0 standard drinks    Comment: once a week  . Drug use: No  . Sexual activity: Yes    Partners: Male  Other Topics Concern  . Not on file  Social History Narrative  . Not on file   Social Determinants of Health   Financial Resource Strain: Not on file  Food Insecurity: Not on file  Transportation Needs: Not on file  Physical Activity: Not on file  Stress: Not on file  Social Connections: Not on file     Family History: The patient's family history includes Heart disease in her father and mother; Liver cancer in her sister; Pneumonia in her brother; Rheum arthritis in her mother.  ROS:   Please see the history of present illness.     All other systems reviewed and are negative.  EKGs/Labs/Other Studies Reviewed:    The following studies were reviewed today: Notes from Dr. Katrinka Blazing, labs from Dr. Katrinka Blazing  EKG:  EKG is ordered today.  The ekg ordered today demonstrates normal sinus rhythm, QS pattern in leads V1 and V2 but otherwise completely normal.  No repolarization changes.  QTc 447 ms.  Recent Labs: No results found for requested labs within last 8760 hours.   02/14/2020 Hemoglobin 13.7, creatinine 1.06, potassium 4.2, normal LFTs, TSH 1.0 Recent Lipid Panel No results found for: CHOL, TRIG, HDL, CHOLHDL, VLDL, LDLCALC, LDLDIRECT 11/11/2019 Cholesterol 234, HDL 79, LDL 130, triglycerides 140  Risk Assessment/Calculations:       Physical Exam:     VS:  BP 130/66 (BP Location: Left Arm, Patient Position: Sitting, Cuff Size: Normal)   Pulse 65   Ht 5\' 5"  (1.651 m)   Wt 128 lb (58.1 kg)   BMI 21.30 kg/m     Wt Readings from Last 3 Encounters:  04/25/20 128 lb (58.1 kg)  04/11/14 135 lb (61.2 kg)  03/21/14 135 lb (61.2 kg)     GEN: Appears lean and fit, younger than stated age well nourished, well developed in no acute distress HEENT: Normal NECK: No JVD; No carotid bruits LYMPHATICS: No lymphadenopathy CARDIAC: RRR, no murmurs, rubs, gallops RESPIRATORY:  Clear to  auscultation without rales, wheezing or rhonchi  ABDOMEN: Soft, non-tender, non-distended MUSCULOSKELETAL:  No edema; No deformity  SKIN: Warm and dry NEUROLOGIC:  Alert and oriented x 3 PSYCHIATRIC:  Normal affect   ASSESSMENT:    1. Renovascular hypertension   2. Hypercholesterolemia    PLAN:    In order of problems listed above:  1. HTN: The abrupt and late onset of hypertension does raise the possibility of renovascular hypertension, which might also explain the mild renal dysfunction.  I explained to the patient that statistically, still much more likely that she simply has high blood pressure due to essential hypertension.  Even if we identify renal artery stenosis, as long as her renal function remained stable and her blood pressure is well controlled, medical therapy is still the first-line treatment.  We will schedule for renal duplex ultrasonography.  Recommended that she continue taking the losartan 100 mg once daily. 2. HLP: If renal artery stenosis is indeed identified, that would bolster the indication for lipid-lowering therapy to a target LDL cholesterol less than 70. 3. GERD: Continue PPI.        Medication Adjustments/Labs and Tests Ordered: Current medicines are reviewed at length with the patient today.  Concerns regarding medicines are outlined above.  Orders Placed This Encounter  Procedures  . EKG 12-Lead  . VAS US RENAL ARTERY  DUPLEX   Meds ordered this encounter  Medications  . losartan (COZAAR) 100 MG tablet    Sig: Take 1 tablet (100 mg total) by mouth daily.    Dispense:  90 tablet    Refill:  3    Patient Instructions  Medication Instructions:  Take Losartan 100 mg once daily  *If you need a refill on your cardiac medications before your next appointment, please call your pharmacy*   Lab Work: None ordered If you have labs (blood work) drawn today and your tests are completely normal, you will receive your results only by: Marland Kitchen MyChart Message (if you have MyChart) OR . A paper copy in the mail If you have any lab test that is abnormal or we need to change your treatment, we will call you to review the results.   Testing/Procedures: Your physician has requested that you have a renal artery duplex. During this test, an ultrasound is used to evaluate blood flow to the kidneys. Take your medications as you usually do. This will take place at [address] [facility], [address].   No food after 11PM the night before.  Water is OK. (Don't drink liquids if you have been instructed not to for ANOTHER test).  Take two Extra-Strength Gas-X capsules at bedtime the night before test.   Take an additional two Extra-Strength Gas-X capsules three (3) hours before the test or first thing in the morning.    Avoid foods that produce bowel gas, for 24 hours prior to exam (see below).    No breakfast, no chewing gum, no smoking or carbonated beverages.  Patient may take morning medications with water.  Come in for test at least 15 minutes early to register.    Follow-Up: At [facility], you and your health needs are our priority.  As part of our continuing mission to provide you with exceptional heart care, we have created designated Provider Care Teams.  These Care Teams include your primary Cardiologist (physician) and Advanced Practice Providers (APPs -  Physician Assistants and Nurse Practitioners) who all  work together to provide you with the care you need, when you  need it.  We recommend signing up for the patient portal called "MyChart".  Sign up information is provided on this After Visit Summary.  MyChart is used to connect with patients for Virtual Visits (Telemedicine).  Patients are able to view lab/test results, encounter notes, upcoming appointments, etc.  Non-urgent messages can be sent to your provider as well.   To learn more about what you can do with MyChart, go to [email or web address].    Your next appointment:   3 month(s)  The format for your next appointment:   In Person  Provider:   You may see Thurmon FairMihai Meliya Mcconahy, MD or one of the following Advanced Practice Providers on your designated Care Team:    Azalee CourseHao Meng, PA-C  Micah FlesherAngela Duke, [place]-C or   Judy PimpleKrista Kroeger, PA-C       Signed, Thurmon FairMihai Kalem Rockwell, MD  04/28/2020 9:42 PM    [facility]

## 2020-05-22 ENCOUNTER — Ambulatory Visit (INDEPENDENT_AMBULATORY_CARE_PROVIDER_SITE_OTHER): Payer: Medicare PPO | Admitting: Podiatry

## 2020-05-22 ENCOUNTER — Other Ambulatory Visit: Payer: Self-pay

## 2020-05-22 DIAGNOSIS — Z9889 Other specified postprocedural states: Secondary | ICD-10-CM

## 2020-05-22 DIAGNOSIS — M2012 Hallux valgus (acquired), left foot: Secondary | ICD-10-CM

## 2020-05-22 NOTE — Progress Notes (Signed)
   Subjective:  Patient presents today status post bunionectomy and hammertoe repair second digit left foot. DOS: 03/16/2020.  Patient states that she is doing very well.  She denies pain.  She does have swelling throughout the day and she wears a compression sock.  She has been very active on her feet  Past Medical History:  Diagnosis Date  . GERD (gastroesophageal reflux disease)   . Headache   . Hyperlipidemia       Objective/Physical Exam Neurovascular status intact.  Skin incisions appear to be well coapted and healed.No dehiscence. No active bleeding noted.  Improved edema noted to the surgical extremity.  No dehiscence and there is no active bleeding.     Assessment: 1. s/p bunionectomy and hammertoe repair second digit left foot. DOS: 03/16/2020   Plan of Care:  1. Patient was evaluated.   2.  Foot is doing very well.  Continue compression ankle sleeve for the chronic edema postoperatively over the next month.  I did explain to the patient that over the next 3 months she may have some residual swelling postoperatively 3.  From a surgical standpoint, the patient may resume full activity no restrictions 4.  Return to clinic as needed  Felecia Shelling, DPM [facility]  Dr. Felecia Shelling, DPM    [address] [place], [place] [address]                Office [phone]  Fax [phone]

## 2020-05-23 ENCOUNTER — Ambulatory Visit (HOSPITAL_COMMUNITY)
Admission: RE | Admit: 2020-05-23 | Discharge: 2020-05-23 | Disposition: A | Discharge status: Home / Self Care | Payer: Medicare PPO | Source: Ambulatory Visit | Attending: Internal Medicine | Admitting: Internal Medicine

## 2020-05-23 DIAGNOSIS — I150 Renovascular hypertension: Secondary | ICD-10-CM | POA: Diagnosis not present

## 2020-05-24 ENCOUNTER — Other Ambulatory Visit: Payer: Self-pay

## 2020-05-24 DIAGNOSIS — I150 Renovascular hypertension: Secondary | ICD-10-CM

## 2020-06-23 DIAGNOSIS — R21 Rash and other nonspecific skin eruption: Secondary | ICD-10-CM | POA: Diagnosis not present

## 2020-06-28 NOTE — Telephone Encounter (Signed)
error 

## 2020-07-25 ENCOUNTER — Encounter: Payer: Self-pay | Admitting: Cardiovascular Disease

## 2020-07-25 ENCOUNTER — Ambulatory Visit: Payer: Medicare PPO | Admitting: Cardiovascular Disease

## 2020-07-25 ENCOUNTER — Other Ambulatory Visit: Payer: Self-pay

## 2020-07-25 VITALS — BP 108/56 | HR 74 | Ht 65.0 in | Wt 125.0 lb

## 2020-07-25 DIAGNOSIS — K2100 Gastro-esophageal reflux disease with esophagitis, without bleeding: Secondary | ICD-10-CM | POA: Diagnosis not present

## 2020-07-25 DIAGNOSIS — I10 Essential (primary) hypertension: Secondary | ICD-10-CM | POA: Diagnosis not present

## 2020-07-25 DIAGNOSIS — E7800 Pure hypercholesterolemia, unspecified: Secondary | ICD-10-CM

## 2020-07-25 NOTE — Patient Instructions (Signed)
Medication Instructions:  No changes *If you need a refill on your cardiac medications before your next appointment, please call your pharmacy*   Lab Work: None ordered If you have labs (blood work) drawn today and your tests are completely normal, you will receive your results only by: MyChart Message (if you have MyChart) OR A paper copy in the mail If you have any lab test that is abnormal or we need to change your treatment, we will call you to review the results.   Testing/Procedures: Your physician has requested that you have a renal artery duplex in May 2023. During this test, an ultrasound is used to evaluate blood flow to the kidneys. Take your medications as you usually do. This will take place at [address] [facility], [address].  No food after 11PM the night before.  Water is OK. (Don't drink liquids if you have been instructed not to for ANOTHER test). Take two Extra-Strength Gas-X capsules at bedtime the night before test.   Take an additional two Extra-Strength Gas-X capsules three (3) hours before the test or first thing in the morning.   Avoid foods that produce bowel gas, for 24 hours prior to exam (see below).   No breakfast, no chewing gum, no smoking or carbonated beverages. Patient may take morning medications with water. Come in for test at least 15 minutes early to register.    Follow-Up: At [facility], you and your health needs are our priority.  As part of our continuing mission to provide you with exceptional heart care, we have created designated Provider Care Teams.  These Care Teams include your primary Cardiologist (physician) and Advanced Practice Providers (APPs -  Physician Assistants and Nurse Practitioners) who all work together to provide you with the care you need, when you need it.  We recommend signing up for the patient portal called "MyChart".  Sign up information is provided on this After Visit Summary.  MyChart is used to connect with  patients for Virtual Visits (Telemedicine).  Patients are able to view lab/test results, encounter notes, upcoming appointments, etc.  Non-urgent messages can be sent to your provider as well.   To learn more about what you can do with MyChart, go to [email or web address].    Your next appointment:   Follow up as needed with Dr. Royann Shivers after the renal doppler

## 2020-07-25 NOTE — Progress Notes (Signed)
Cardiology Consultation Note:    Date:  07/25/2020   ID:  Bailey Ford, DOB 1945-11-27, MRN [id number]  PCP:  Bailey Brunette, MD   [facility]  Cardiologist:  Thurmon Fair, MD  Advanced Practice Provider:  No care team member to display Electrophysiologist:  None       Referring MD: Bailey Brunette, MD   Chief Complaint  Patient presents with   Follow-up    3 months.  HTN   History of Present Illness:    Bailey Ford is a 75 y.o. female with a hx of excellent health throughout her life, mild hyperlipidemia, GERD, with recent onset of marked hypertension.  After starting losartan her blood pressure control is excellent.  Typically at home her blood pressure is 120-130s/60s.  Today it is even lower at 108/56.  She denies dizziness or orthostatic symptoms.  She has not had palpitations, syncope, edema, chest pain or shortness of breath at rest or with activity.  She continues to walk on a daily basis.  She has exercised her whole life and has been a longtime runner.  Now she walks on a daily basis.  She did not complain of nocturia today.  She reports that her only ongoing issue, which is longstanding, is acid reflux that is well controlled by pantoprazole.  However she has to stop the pantoprazole periodically and use famotidine instead because of diarrhea.  Renal duplex ultrasound shows minor plaque with a insignificant stenosis on the right side but with increased renal resistive indices bilaterally, which would suggest that she has had more longstanding hypertension.  However her most recent creatinine was in normal range at 1.06 (but because of her age and lean body status, this calculates out to a GFR of about 50.).  One of her biggest complaints is nocturia.  Her urine output consistently is higher in the evening and at.  This raised the possible diagnosis of heart failure.  She seen a urologist, but did not really.  She has started  treatment with atorvastatin for her hyperlipidemia.  She has never smoked.  She does not have diabetes mellitus.  She has always been lean and fit.  Her mother died of a myocardial infarction but was a smoker.  Her father had some type of heart problem and she remembers that he used nitroglycerin.  She bears a diagnosis of CKD stage III; her most recent creatinine from January 31 while normal at 1.06, does indeed correspond to a GFR=51.Marland Kitchen  Her potassium is normal.  TSH is normal.  She takes chronic PPI therapy for GERD with Barrett's esophagus.  Past Medical History:  Diagnosis Date   GERD (gastroesophageal reflux disease)    Headache    Hyperlipidemia     Past Surgical History:  Procedure Laterality Date   ABDOMINAL HYSTERECTOMY     BREAST BIOPSY     non cancerous   FACIAL COSMETIC SURGERY  2001    Current Medications: Current Meds  Medication Sig   atorvastatin (LIPITOR) 40 MG tablet Take 40 mg by mouth daily.   betamethasone valerate ointment (VALISONE) 0.1 % Apply thin layer to affected BID prn   BIOTIN PO Take by mouth daily.   diazepam (VALIUM) 5 MG tablet Take 5 mg by mouth daily as needed.   estradiol (ESTRACE) 0.1 MG/GM vaginal cream Apply small amount of cream to urethral opening daily x 3 weeks   losartan (COZAAR) 100 MG tablet Take 1 tablet (100 mg total) by mouth  daily.   melatonin 3 MG TABS tablet Take by mouth.   Multiple Vitamin (MULTIVITAMIN) tablet Take 1 tablet by mouth daily.   pantoprazole (PROTONIX) 40 MG tablet Take 40 mg by mouth daily.    PREMARIN 1.25 MG tablet Take 0.31 mg by mouth daily.    Probiotic Product (ALIGN PO) Take 1 tablet by mouth daily.   tretinoin (RETIN-A) 0.1 % cream APPLY 1 CREAM TOPICALLY EVERY DAY AT BEDTIME   [DISCONTINUED] atorvastatin (LIPITOR) 20 MG tablet Take 20 mg by mouth daily.   [DISCONTINUED] Cyanocobalamin (VITAMIN B 12 PO) Take 1 capsule by mouth daily.     Allergies:   Patient has no known allergies.   Social History    Socioeconomic History   Marital status: Single    Spouse name: Not on file   Number of children: Not on file   Years of education: Not on file   Highest education level: Not on file  Occupational History   Not on file  Tobacco Use   Smoking status: Never   Smokeless tobacco: Never  Substance and Sexual Activity   Alcohol use: Yes    Alcohol/week: 0.0 standard drinks    Comment: once a week   Drug use: No   Sexual activity: Yes    Partners: Male  Other Topics Concern   Not on file  Social History Narrative   Not on file   Social Determinants of Health   Financial Resource Strain: Not on file  Food Insecurity: Not on file  Transportation Needs: Not on file  Physical Activity: Not on file  Stress: Not on file  Social Connections: Not on file     Family History: The patient's family history includes Heart disease in her father and mother; Liver cancer in her sister; Pneumonia in her brother; Rheum arthritis in her mother.  ROS:   Please see the history of present illness.     All other systems reviewed and are negative.  EKGs/Labs/Other Studies Reviewed:    The following studies were reviewed today: Notes from Dr. Katrinka BlazingSmith, labs from Dr. Katrinka BlazingSmith   Renal artery Duplex US 05/23/2020 Right: 1-59% stenosis of the right renal artery. RRV flow present.         Normal size right kidney. Abnormal right Resistive Index.         Normal cortical thickness of right kidney.  Left:  No evidence of left renal artery stenosis. LRV flow present.         Normal size of left kidney. Abnormal left Resisitve Index.         Normal cortical thickness of the left kidney.  Mesenteric:  Normal Celiac artery and Superior Mesenteric artery findings. Areas of  limited  visceral study include left kidney size and left renal artery due to bowel  gas.      EKG:  EKG is ordered today.  The ekg ordered today demonstrates normal sinus rhythm, QS pattern in leads V1 and V2 but otherwise completely  normal.  No repolarization changes.  QTc 447 ms.  Recent Labs: No results found for requested labs within last 8760 hours.   02/14/2020 Hemoglobin 13.7, creatinine 1.06, potassium 4.2, normal LFTs, TSH 1.0 Recent Lipid Panel No results found for: CHOL, TRIG, HDL, CHOLHDL, VLDL, LDLCALC, LDLDIRECT 11/11/2019 Cholesterol 234, HDL 79, LDL 130, triglycerides 140  Risk Assessment/Calculations:       Physical Exam:    VS:  BP (!) 108/56 (BP Location: Left Arm, Patient Position: Sitting, Cuff Size:  Normal)   Pulse 74   Ht 5\' 5"  (1.651 m)   Wt 125 lb (56.7 kg)   BMI 20.80 kg/m     Wt Readings from Last 3 Encounters:  07/25/20 125 lb (56.7 kg)  04/25/20 128 lb (58.1 kg)  04/11/14 135 lb (61.2 kg)     General: Alert, oriented x3, no distress, lean, fit, appears younger than stated age Head: no evidence of trauma, PERRL, EOMI, no exophtalmos or lid lag, no myxedema, no xanthelasma; normal ears, nose and oropharynx Neck: normal jugular venous pulsations and no hepatojugular reflux; brisk carotid pulses without delay and no carotid bruits Chest: clear to auscultation, no signs of consolidation by percussion or palpation, normal fremitus, symmetrical and full respiratory excursions Cardiovascular: normal position and quality of the apical impulse, regular rhythm, normal first and second heart sounds, no murmurs, rubs or gallops Abdomen: no tenderness or distention, no masses by palpation, no abnormal pulsatility or arterial bruits, normal bowel sounds, no hepatosplenomegaly Extremities: no clubbing, cyanosis or edema; 2+ radial, ulnar and brachial pulses bilaterally; 2+ right femoral, posterior tibial and dorsalis pedis pulses; 2+ left femoral, posterior tibial and dorsalis pedis pulses; no subclavian or femoral bruits Neurological: grossly nonfocal Psych: Normal mood and affect   ASSESSMENT:    1. Essential hypertension   2. Hypercholesterolemia   3. Gastroesophageal reflux disease  with esophagitis without hemorrhage     PLAN:    In order of problems listed above:  HTN: Excellent control on losartan 100 mg daily which I recommend that she continue.  Would repeat her renal duplex ultrasound at a 1 year interval, but if there are no changes I do not think this will require long-term monitoring.  If her blood pressure control remains normal as well, I do not think she would need further routine cardiology follow-up. HLP: No clinically evident CAD or PAD.  No clear evidence of true aortic or renal atherosclerotic abnormalities.  Continue current dose of atorvastatin. GERD: Continue PPI and/or H2 blockers.  She is concerned about the possible adverse effect these may have on kidney function, but I reassured her that this is unlikely to occur.  Chronic use of PPI may be too high risk of osteoporosis which is a concern in a very lean for skin person like she is, which is why she should continue to alternate treatment with pantoprazole and famotidine.        Medication Adjustments/Labs and Tests Ordered: Current medicines are reviewed at length with the patient today.  Concerns regarding medicines are outlined above.  No orders of the defined types were placed in this encounter.  No orders of the defined types were placed in this encounter.   Patient Instructions  Medication Instructions:  No changes *If you need a refill on your cardiac medications before your next appointment, please call your pharmacy*   Lab Work: None ordered If you have labs (blood work) drawn today and your tests are completely normal, you will receive your results only by: MyChart Message (if you have MyChart) OR A paper copy in the mail If you have any lab test that is abnormal or we need to change your treatment, we will call you to review the results.   Testing/Procedures: Your physician has requested that you have a renal artery duplex in May 2023. During this test, an ultrasound is used  to evaluate blood flow to the kidneys. Take your medications as you usually do. This will take place at [address], [address].  No food after 11PM the night before.  Water is OK. (Don't drink liquids if you have been instructed not to for ANOTHER test). Take two Extra-Strength Gas-X capsules at bedtime the night before test.   Take an additional two Extra-Strength Gas-X capsules three (3) hours before the test or first thing in the morning.   Avoid foods that produce bowel gas, for 24 hours prior to exam (see below).   No breakfast, no chewing gum, no smoking or carbonated beverages. Patient may take morning medications with water. Come in for test at least 15 minutes early to register.    Follow-Up: At [facility], you and your health needs are our priority.  As part of our continuing mission to provide you with exceptional heart care, we have created designated Provider Care Teams.  These Care Teams include your primary Cardiologist (physician) and Advanced Practice Providers (APPs -  Physician Assistants and Nurse Practitioners) who all work together to provide you with the care you need, when you need it.  We recommend signing up for the patient portal called "MyChart".  Sign up information is provided on this After Visit Summary.  MyChart is used to connect with patients for Virtual Visits (Telemedicine).  Patients are able to view lab/test results, encounter notes, upcoming appointments, etc.  Non-urgent messages can be sent to your provider as well.   To learn more about what you can do with MyChart, go to [email or web address].    Your next appointment:   Follow up as needed with Dr. Royann Shivers after the renal doppler   Signed, Thurmon Fair, MD  07/25/2020 9:43 AM    [facility]

## 2020-08-11 ENCOUNTER — Other Ambulatory Visit: Payer: Self-pay | Admitting: Family Medicine

## 2020-08-11 DIAGNOSIS — E2839 Other primary ovarian failure: Secondary | ICD-10-CM

## 2020-08-16 DIAGNOSIS — Z1231 Encounter for screening mammogram for malignant neoplasm of breast: Secondary | ICD-10-CM | POA: Diagnosis not present

## 2020-09-19 DIAGNOSIS — I700 Atherosclerosis of aorta: Secondary | ICD-10-CM | POA: Diagnosis not present

## 2020-09-19 DIAGNOSIS — N183 Chronic kidney disease, stage 3 unspecified: Secondary | ICD-10-CM | POA: Diagnosis not present

## 2020-09-19 DIAGNOSIS — K219 Gastro-esophageal reflux disease without esophagitis: Secondary | ICD-10-CM | POA: Diagnosis not present

## 2020-09-19 DIAGNOSIS — Z0000 Encounter for general adult medical examination without abnormal findings: Secondary | ICD-10-CM | POA: Diagnosis not present

## 2020-09-19 DIAGNOSIS — Z23 Encounter for immunization: Secondary | ICD-10-CM | POA: Diagnosis not present

## 2020-09-19 DIAGNOSIS — I10 Essential (primary) hypertension: Secondary | ICD-10-CM | POA: Diagnosis not present

## 2020-09-19 DIAGNOSIS — Z1389 Encounter for screening for other disorder: Secondary | ICD-10-CM | POA: Diagnosis not present

## 2020-09-19 DIAGNOSIS — E7800 Pure hypercholesterolemia, unspecified: Secondary | ICD-10-CM | POA: Diagnosis not present

## 2020-09-25 DIAGNOSIS — F4320 Adjustment disorder, unspecified: Secondary | ICD-10-CM | POA: Diagnosis not present

## 2020-11-08 DIAGNOSIS — Z808 Family history of malignant neoplasm of other organs or systems: Secondary | ICD-10-CM | POA: Diagnosis not present

## 2020-11-08 DIAGNOSIS — L821 Other seborrheic keratosis: Secondary | ICD-10-CM | POA: Diagnosis not present

## 2020-11-08 DIAGNOSIS — D2272 Melanocytic nevi of left lower limb, including hip: Secondary | ICD-10-CM | POA: Diagnosis not present

## 2020-11-08 DIAGNOSIS — L570 Actinic keratosis: Secondary | ICD-10-CM | POA: Diagnosis not present

## 2020-11-08 DIAGNOSIS — Z85828 Personal history of other malignant neoplasm of skin: Secondary | ICD-10-CM | POA: Diagnosis not present

## 2020-11-08 DIAGNOSIS — L578 Other skin changes due to chronic exposure to nonionizing radiation: Secondary | ICD-10-CM | POA: Diagnosis not present

## 2020-11-16 DIAGNOSIS — M79605 Pain in left leg: Secondary | ICD-10-CM | POA: Diagnosis not present

## 2020-11-16 DIAGNOSIS — I10 Essential (primary) hypertension: Secondary | ICD-10-CM | POA: Diagnosis not present

## 2020-11-16 DIAGNOSIS — Z9109 Other allergy status, other than to drugs and biological substances: Secondary | ICD-10-CM | POA: Diagnosis not present

## 2020-11-16 DIAGNOSIS — M549 Dorsalgia, unspecified: Secondary | ICD-10-CM | POA: Diagnosis not present

## 2020-11-16 DIAGNOSIS — F5101 Primary insomnia: Secondary | ICD-10-CM | POA: Diagnosis not present

## 2020-12-11 DIAGNOSIS — L42 Pityriasis rosea: Secondary | ICD-10-CM | POA: Diagnosis not present

## 2020-12-11 DIAGNOSIS — N1831 Chronic kidney disease, stage 3a: Secondary | ICD-10-CM | POA: Diagnosis not present

## 2020-12-11 DIAGNOSIS — I10 Essential (primary) hypertension: Secondary | ICD-10-CM | POA: Diagnosis not present

## 2021-01-10 DIAGNOSIS — M542 Cervicalgia: Secondary | ICD-10-CM | POA: Diagnosis not present

## 2021-01-10 DIAGNOSIS — R519 Headache, unspecified: Secondary | ICD-10-CM | POA: Diagnosis not present

## 2021-01-10 DIAGNOSIS — R21 Rash and other nonspecific skin eruption: Secondary | ICD-10-CM | POA: Diagnosis not present

## 2021-01-10 DIAGNOSIS — F5101 Primary insomnia: Secondary | ICD-10-CM | POA: Diagnosis not present

## 2021-01-31 DIAGNOSIS — K2289 Other specified disease of esophagus: Secondary | ICD-10-CM | POA: Diagnosis not present

## 2021-01-31 DIAGNOSIS — K2270 Barrett's esophagus without dysplasia: Secondary | ICD-10-CM | POA: Diagnosis not present

## 2021-01-31 DIAGNOSIS — Z8719 Personal history of other diseases of the digestive system: Secondary | ICD-10-CM | POA: Diagnosis not present

## 2021-01-31 DIAGNOSIS — K2950 Unspecified chronic gastritis without bleeding: Secondary | ICD-10-CM | POA: Diagnosis not present

## 2021-01-31 DIAGNOSIS — K209 Esophagitis, unspecified without bleeding: Secondary | ICD-10-CM | POA: Diagnosis not present

## 2021-01-31 DIAGNOSIS — K3189 Other diseases of stomach and duodenum: Secondary | ICD-10-CM | POA: Diagnosis not present

## 2021-01-31 DIAGNOSIS — K449 Diaphragmatic hernia without obstruction or gangrene: Secondary | ICD-10-CM | POA: Diagnosis not present

## 2021-02-02 ENCOUNTER — Other Ambulatory Visit: Payer: Self-pay

## 2021-02-02 ENCOUNTER — Ambulatory Visit
Admission: RE | Admit: 2021-02-02 | Discharge: 2021-02-02 | Disposition: A | Discharge status: Home / Self Care | Payer: Medicare PPO | Source: Ambulatory Visit | Attending: Family Medicine | Admitting: Family Medicine

## 2021-02-02 DIAGNOSIS — E2839 Other primary ovarian failure: Secondary | ICD-10-CM

## 2021-02-05 DIAGNOSIS — M542 Cervicalgia: Secondary | ICD-10-CM | POA: Diagnosis not present

## 2021-02-12 DIAGNOSIS — L853 Xerosis cutis: Secondary | ICD-10-CM | POA: Diagnosis not present

## 2021-02-12 DIAGNOSIS — L821 Other seborrheic keratosis: Secondary | ICD-10-CM | POA: Diagnosis not present

## 2021-02-12 DIAGNOSIS — L503 Dermatographic urticaria: Secondary | ICD-10-CM | POA: Diagnosis not present

## 2021-05-24 ENCOUNTER — Other Ambulatory Visit (HOSPITAL_COMMUNITY): Payer: Self-pay | Admitting: Cardiovascular Disease

## 2021-05-24 DIAGNOSIS — I150 Renovascular hypertension: Secondary | ICD-10-CM

## 2021-05-28 ENCOUNTER — Inpatient Hospital Stay (HOSPITAL_COMMUNITY): Admission: RE | Admit: 2021-05-28 | Payer: Medicare PPO | Source: Ambulatory Visit

## 2021-06-20 ENCOUNTER — Ambulatory Visit (HOSPITAL_COMMUNITY)
Admission: RE | Admit: 2021-06-20 | Discharge: 2021-06-20 | Disposition: A | Discharge status: Home / Self Care | Payer: Medicare PPO | Source: Ambulatory Visit | Attending: Cardiovascular Disease | Admitting: Cardiovascular Disease

## 2021-06-20 DIAGNOSIS — I150 Renovascular hypertension: Secondary | ICD-10-CM | POA: Insufficient documentation

## 2021-08-21 DIAGNOSIS — Z1231 Encounter for screening mammogram for malignant neoplasm of breast: Secondary | ICD-10-CM | POA: Diagnosis not present

## 2021-08-24 DIAGNOSIS — R928 Other abnormal and inconclusive findings on diagnostic imaging of breast: Secondary | ICD-10-CM | POA: Diagnosis not present

## 2021-09-05 DIAGNOSIS — L570 Actinic keratosis: Secondary | ICD-10-CM | POA: Diagnosis not present

## 2021-09-05 DIAGNOSIS — N760 Acute vaginitis: Secondary | ICD-10-CM | POA: Diagnosis not present

## 2021-09-05 DIAGNOSIS — L2089 Other atopic dermatitis: Secondary | ICD-10-CM | POA: Diagnosis not present

## 2021-10-10 DIAGNOSIS — Z7184 Encounter for health counseling related to travel: Secondary | ICD-10-CM | POA: Diagnosis not present

## 2021-10-10 DIAGNOSIS — R399 Unspecified symptoms and signs involving the genitourinary system: Secondary | ICD-10-CM | POA: Diagnosis not present

## 2021-10-10 DIAGNOSIS — N3000 Acute cystitis without hematuria: Secondary | ICD-10-CM | POA: Diagnosis not present

## 2021-10-10 DIAGNOSIS — E2839 Other primary ovarian failure: Secondary | ICD-10-CM | POA: Diagnosis not present

## 2021-10-10 DIAGNOSIS — M545 Low back pain, unspecified: Secondary | ICD-10-CM | POA: Diagnosis not present

## 2021-11-12 DIAGNOSIS — L570 Actinic keratosis: Secondary | ICD-10-CM | POA: Diagnosis not present

## 2021-11-12 DIAGNOSIS — L814 Other melanin hyperpigmentation: Secondary | ICD-10-CM | POA: Diagnosis not present

## 2021-11-12 DIAGNOSIS — X32XXXS Exposure to sunlight, sequela: Secondary | ICD-10-CM | POA: Diagnosis not present

## 2021-11-12 DIAGNOSIS — L821 Other seborrheic keratosis: Secondary | ICD-10-CM | POA: Diagnosis not present

## 2021-11-12 DIAGNOSIS — D485 Neoplasm of uncertain behavior of skin: Secondary | ICD-10-CM | POA: Diagnosis not present

## 2021-11-12 DIAGNOSIS — B078 Other viral warts: Secondary | ICD-10-CM | POA: Diagnosis not present

## 2021-11-14 DIAGNOSIS — N1831 Chronic kidney disease, stage 3a: Secondary | ICD-10-CM | POA: Diagnosis not present

## 2021-11-14 DIAGNOSIS — F5101 Primary insomnia: Secondary | ICD-10-CM | POA: Diagnosis not present

## 2021-11-14 DIAGNOSIS — I700 Atherosclerosis of aorta: Secondary | ICD-10-CM | POA: Diagnosis not present

## 2021-11-14 DIAGNOSIS — Z1331 Encounter for screening for depression: Secondary | ICD-10-CM | POA: Diagnosis not present

## 2021-11-14 DIAGNOSIS — E7800 Pure hypercholesterolemia, unspecified: Secondary | ICD-10-CM | POA: Diagnosis not present

## 2021-11-14 DIAGNOSIS — K219 Gastro-esophageal reflux disease without esophagitis: Secondary | ICD-10-CM | POA: Diagnosis not present

## 2021-11-14 DIAGNOSIS — Z0000 Encounter for general adult medical examination without abnormal findings: Secondary | ICD-10-CM | POA: Diagnosis not present

## 2021-12-28 DIAGNOSIS — U071 COVID-19: Secondary | ICD-10-CM | POA: Diagnosis not present

## 2021-12-28 DIAGNOSIS — R5383 Other fatigue: Secondary | ICD-10-CM | POA: Diagnosis not present

## 2021-12-28 DIAGNOSIS — R52 Pain, unspecified: Secondary | ICD-10-CM | POA: Diagnosis not present

## 2021-12-28 DIAGNOSIS — R059 Cough, unspecified: Secondary | ICD-10-CM | POA: Diagnosis not present

## 2021-12-28 DIAGNOSIS — R0981 Nasal congestion: Secondary | ICD-10-CM | POA: Diagnosis not present

## 2022-02-13 DIAGNOSIS — R351 Nocturia: Secondary | ICD-10-CM | POA: Diagnosis not present

## 2022-03-21 DIAGNOSIS — Z79890 Hormone replacement therapy (postmenopausal): Secondary | ICD-10-CM | POA: Diagnosis not present

## 2022-05-17 DIAGNOSIS — N952 Postmenopausal atrophic vaginitis: Secondary | ICD-10-CM | POA: Diagnosis not present

## 2022-05-17 DIAGNOSIS — Z79890 Hormone replacement therapy (postmenopausal): Secondary | ICD-10-CM | POA: Diagnosis not present

## 2022-05-17 DIAGNOSIS — N951 Menopausal and female climacteric states: Secondary | ICD-10-CM | POA: Diagnosis not present

## 2022-05-17 DIAGNOSIS — Z90710 Acquired absence of both cervix and uterus: Secondary | ICD-10-CM | POA: Diagnosis not present

## 2022-05-17 DIAGNOSIS — Z01411 Encounter for gynecological examination (general) (routine) with abnormal findings: Secondary | ICD-10-CM | POA: Diagnosis not present

## 2022-05-17 DIAGNOSIS — N904 Leukoplakia of vulva: Secondary | ICD-10-CM | POA: Diagnosis not present

## 2022-05-17 DIAGNOSIS — N362 Urethral caruncle: Secondary | ICD-10-CM | POA: Diagnosis not present

## 2022-05-28 DIAGNOSIS — F5101 Primary insomnia: Secondary | ICD-10-CM | POA: Diagnosis not present

## 2022-05-28 DIAGNOSIS — K219 Gastro-esophageal reflux disease without esophagitis: Secondary | ICD-10-CM | POA: Diagnosis not present

## 2022-05-28 DIAGNOSIS — I10 Essential (primary) hypertension: Secondary | ICD-10-CM | POA: Diagnosis not present

## 2022-05-28 DIAGNOSIS — E7800 Pure hypercholesterolemia, unspecified: Secondary | ICD-10-CM | POA: Diagnosis not present

## 2022-05-28 DIAGNOSIS — N1831 Chronic kidney disease, stage 3a: Secondary | ICD-10-CM | POA: Diagnosis not present

## 2022-05-28 DIAGNOSIS — I700 Atherosclerosis of aorta: Secondary | ICD-10-CM | POA: Diagnosis not present

## 2022-05-28 DIAGNOSIS — E2839 Other primary ovarian failure: Secondary | ICD-10-CM | POA: Diagnosis not present

## 2022-06-17 DIAGNOSIS — M720 Palmar fascial fibromatosis [Dupuytren]: Secondary | ICD-10-CM | POA: Diagnosis not present

## 2022-07-15 DIAGNOSIS — Z961 Presence of intraocular lens: Secondary | ICD-10-CM | POA: Diagnosis not present

## 2022-07-15 DIAGNOSIS — H43813 Vitreous degeneration, bilateral: Secondary | ICD-10-CM | POA: Diagnosis not present

## 2022-07-15 DIAGNOSIS — H26493 Other secondary cataract, bilateral: Secondary | ICD-10-CM | POA: Diagnosis not present

## 2022-07-15 DIAGNOSIS — H04123 Dry eye syndrome of bilateral lacrimal glands: Secondary | ICD-10-CM | POA: Diagnosis not present

## 2022-07-26 DIAGNOSIS — H26491 Other secondary cataract, right eye: Secondary | ICD-10-CM | POA: Diagnosis not present

## 2022-08-27 DIAGNOSIS — Z1231 Encounter for screening mammogram for malignant neoplasm of breast: Secondary | ICD-10-CM | POA: Diagnosis not present

## 2022-09-02 DIAGNOSIS — H26493 Other secondary cataract, bilateral: Secondary | ICD-10-CM | POA: Diagnosis not present

## 2022-09-11 DIAGNOSIS — M545 Low back pain, unspecified: Secondary | ICD-10-CM | POA: Diagnosis not present

## 2022-09-11 DIAGNOSIS — M47816 Spondylosis without myelopathy or radiculopathy, lumbar region: Secondary | ICD-10-CM | POA: Diagnosis not present

## 2022-09-11 DIAGNOSIS — M419 Scoliosis, unspecified: Secondary | ICD-10-CM | POA: Diagnosis not present

## 2022-09-11 DIAGNOSIS — M4316 Spondylolisthesis, lumbar region: Secondary | ICD-10-CM | POA: Diagnosis not present

## 2022-10-02 DIAGNOSIS — M545 Low back pain, unspecified: Secondary | ICD-10-CM | POA: Diagnosis not present

## 2022-10-07 DIAGNOSIS — M48061 Spinal stenosis, lumbar region without neurogenic claudication: Secondary | ICD-10-CM | POA: Diagnosis not present

## 2022-10-07 DIAGNOSIS — M5126 Other intervertebral disc displacement, lumbar region: Secondary | ICD-10-CM | POA: Diagnosis not present

## 2022-10-07 DIAGNOSIS — M545 Low back pain, unspecified: Secondary | ICD-10-CM | POA: Diagnosis not present

## 2022-10-07 DIAGNOSIS — M5136 Other intervertebral disc degeneration, lumbar region: Secondary | ICD-10-CM | POA: Diagnosis not present

## 2022-10-07 DIAGNOSIS — M47816 Spondylosis without myelopathy or radiculopathy, lumbar region: Secondary | ICD-10-CM | POA: Diagnosis not present

## 2022-11-22 DIAGNOSIS — M48061 Spinal stenosis, lumbar region without neurogenic claudication: Secondary | ICD-10-CM | POA: Diagnosis not present

## 2022-12-06 DIAGNOSIS — M5416 Radiculopathy, lumbar region: Secondary | ICD-10-CM | POA: Diagnosis not present

## 2022-12-10 DIAGNOSIS — K219 Gastro-esophageal reflux disease without esophagitis: Secondary | ICD-10-CM | POA: Diagnosis not present

## 2022-12-10 DIAGNOSIS — F5101 Primary insomnia: Secondary | ICD-10-CM | POA: Diagnosis not present

## 2022-12-10 DIAGNOSIS — Z1159 Encounter for screening for other viral diseases: Secondary | ICD-10-CM | POA: Diagnosis not present

## 2022-12-10 DIAGNOSIS — I10 Essential (primary) hypertension: Secondary | ICD-10-CM | POA: Diagnosis not present

## 2022-12-10 DIAGNOSIS — Z0000 Encounter for general adult medical examination without abnormal findings: Secondary | ICD-10-CM | POA: Diagnosis not present

## 2022-12-10 DIAGNOSIS — E7800 Pure hypercholesterolemia, unspecified: Secondary | ICD-10-CM | POA: Diagnosis not present

## 2022-12-10 DIAGNOSIS — Z1331 Encounter for screening for depression: Secondary | ICD-10-CM | POA: Diagnosis not present

## 2022-12-10 DIAGNOSIS — N1831 Chronic kidney disease, stage 3a: Secondary | ICD-10-CM | POA: Diagnosis not present

## 2022-12-18 DIAGNOSIS — Z808 Family history of malignant neoplasm of other organs or systems: Secondary | ICD-10-CM | POA: Diagnosis not present

## 2022-12-18 DIAGNOSIS — Z85828 Personal history of other malignant neoplasm of skin: Secondary | ICD-10-CM | POA: Diagnosis not present

## 2022-12-18 DIAGNOSIS — D2272 Melanocytic nevi of left lower limb, including hip: Secondary | ICD-10-CM | POA: Diagnosis not present

## 2022-12-18 DIAGNOSIS — D485 Neoplasm of uncertain behavior of skin: Secondary | ICD-10-CM | POA: Diagnosis not present

## 2022-12-18 DIAGNOSIS — L570 Actinic keratosis: Secondary | ICD-10-CM | POA: Diagnosis not present

## 2022-12-18 DIAGNOSIS — I781 Nevus, non-neoplastic: Secondary | ICD-10-CM | POA: Diagnosis not present

## 2022-12-18 DIAGNOSIS — L820 Inflamed seborrheic keratosis: Secondary | ICD-10-CM | POA: Diagnosis not present

## 2022-12-18 DIAGNOSIS — L814 Other melanin hyperpigmentation: Secondary | ICD-10-CM | POA: Diagnosis not present

## 2022-12-25 DIAGNOSIS — M5416 Radiculopathy, lumbar region: Secondary | ICD-10-CM | POA: Diagnosis not present

## 2023-03-20 DIAGNOSIS — N898 Other specified noninflammatory disorders of vagina: Secondary | ICD-10-CM | POA: Diagnosis not present

## 2023-03-20 DIAGNOSIS — N9489 Other specified conditions associated with female genital organs and menstrual cycle: Secondary | ICD-10-CM | POA: Diagnosis not present

## 2023-05-27 DIAGNOSIS — M5416 Radiculopathy, lumbar region: Secondary | ICD-10-CM | POA: Diagnosis not present

## 2023-05-29 DIAGNOSIS — R399 Unspecified symptoms and signs involving the genitourinary system: Secondary | ICD-10-CM | POA: Diagnosis not present

## 2023-05-29 DIAGNOSIS — N390 Urinary tract infection, site not specified: Secondary | ICD-10-CM | POA: Diagnosis not present

## 2023-05-29 DIAGNOSIS — N1831 Chronic kidney disease, stage 3a: Secondary | ICD-10-CM | POA: Diagnosis not present

## 2023-06-17 DIAGNOSIS — N1831 Chronic kidney disease, stage 3a: Secondary | ICD-10-CM | POA: Diagnosis not present

## 2023-06-17 DIAGNOSIS — K219 Gastro-esophageal reflux disease without esophagitis: Secondary | ICD-10-CM | POA: Diagnosis not present

## 2023-06-17 DIAGNOSIS — F5101 Primary insomnia: Secondary | ICD-10-CM | POA: Diagnosis not present

## 2023-06-17 DIAGNOSIS — I10 Essential (primary) hypertension: Secondary | ICD-10-CM | POA: Diagnosis not present

## 2023-06-17 DIAGNOSIS — Z23 Encounter for immunization: Secondary | ICD-10-CM | POA: Diagnosis not present

## 2023-06-17 DIAGNOSIS — R350 Frequency of micturition: Secondary | ICD-10-CM | POA: Diagnosis not present

## 2023-06-17 DIAGNOSIS — I700 Atherosclerosis of aorta: Secondary | ICD-10-CM | POA: Diagnosis not present

## 2023-06-17 DIAGNOSIS — E7800 Pure hypercholesterolemia, unspecified: Secondary | ICD-10-CM | POA: Diagnosis not present

## 2023-06-20 DIAGNOSIS — M5431 Sciatica, right side: Secondary | ICD-10-CM | POA: Diagnosis not present

## 2023-07-08 DIAGNOSIS — M5431 Sciatica, right side: Secondary | ICD-10-CM | POA: Diagnosis not present

## 2023-07-31 DIAGNOSIS — R300 Dysuria: Secondary | ICD-10-CM | POA: Diagnosis not present

## 2023-07-31 DIAGNOSIS — R350 Frequency of micturition: Secondary | ICD-10-CM | POA: Diagnosis not present

## 2023-09-02 DIAGNOSIS — Z1231 Encounter for screening mammogram for malignant neoplasm of breast: Secondary | ICD-10-CM | POA: Diagnosis not present

## 2023-12-24 ENCOUNTER — Encounter: Payer: Self-pay | Admitting: Internal Medicine

## 2024-02-04 ENCOUNTER — Other Ambulatory Visit (INDEPENDENT_AMBULATORY_CARE_PROVIDER_SITE_OTHER)

## 2024-02-04 ENCOUNTER — Ambulatory Visit: Payer: Self-pay | Admitting: Internal Medicine

## 2024-02-04 ENCOUNTER — Encounter: Payer: Self-pay | Admitting: Internal Medicine

## 2024-02-04 ENCOUNTER — Ambulatory Visit: Admitting: Internal Medicine

## 2024-02-04 VITALS — BP 108/50 | HR 62 | Ht 65.0 in | Wt 127.4 lb

## 2024-02-04 DIAGNOSIS — K449 Diaphragmatic hernia without obstruction or gangrene: Secondary | ICD-10-CM

## 2024-02-04 DIAGNOSIS — K2270 Barrett's esophagus without dysplasia: Secondary | ICD-10-CM

## 2024-02-04 DIAGNOSIS — K2950 Unspecified chronic gastritis without bleeding: Secondary | ICD-10-CM | POA: Diagnosis not present

## 2024-02-04 DIAGNOSIS — K219 Gastro-esophageal reflux disease without esophagitis: Secondary | ICD-10-CM

## 2024-02-04 DIAGNOSIS — L659 Nonscarring hair loss, unspecified: Secondary | ICD-10-CM

## 2024-02-04 DIAGNOSIS — R1013 Epigastric pain: Secondary | ICD-10-CM | POA: Diagnosis not present

## 2024-02-04 DIAGNOSIS — K2970 Gastritis, unspecified, without bleeding: Secondary | ICD-10-CM

## 2024-02-04 LAB — VITAMIN D 25 HYDROXY (VIT D DEFICIENCY, FRACTURES): VITD: 68.28 ng/mL (ref 30.00–100.00)

## 2024-02-04 LAB — B12 AND FOLATE PANEL
Folate: >23.4 ng/mL
Vitamin B-12: 554 pg/mL (ref 211–911)

## 2024-02-04 LAB — LIPASE: Lipase: 17 U/L (ref 11.0–59.0)

## 2024-02-04 LAB — IBC + FERRITIN
Ferritin: 39.2 ng/mL (ref 10.0–291.0)
Iron: 113 ug/dL (ref 42–145)
Saturation Ratios: 31.8 % (ref 20.0–50.0)
TIBC: 355.6 ug/dL (ref 250.0–450.0)
Transferrin: 254 mg/dL (ref 212.0–360.0)

## 2024-02-04 NOTE — Patient Instructions (Signed)
 You have been scheduled for an endoscopy. Please follow written instructions given to you at your visit today.  If you use inhalers (even only as needed), please bring them with you on the day of your procedure.  If you take any of the following medications, they will need to be adjusted prior to your procedure:   DO NOT TAKE 7 DAYS PRIOR TO TEST- Trulicity (dulaglutide) Ozempic, Wegovy (semaglutide) Mounjaro, Zepbound (tirzepatide) Bydureon Bcise (exanatide extended release)  DO NOT TAKE 1 DAY PRIOR TO YOUR TEST Rybelsus (semaglutide) Adlyxin (lixisenatide) Victoza (liraglutide) Byetta (exanatide) ___________________________________________________________________________   Please go to the lab in the basement of our building to have lab work done as you leave today. Hit B for basement when you get on the elevator.  When the doors open the lab is on your left.  We will call you with the results. Thank you.   You have been scheduled for an abdominal ultrasound at [facility] on Tuesday, 02-10-24 at 10:00am. Please arrive 15 minutes prior to your appointment for registration. Make certain not to have anything to eat or drink 6 hours prior to your appointment. Should you need to reschedule your appointment, please contact radiology at [phone]. This test typically takes about 30 minutes to perform.   Thank you for entrusting me with your care and for choosing [facility], Dr. Estefana Kidney  _______________________________________________________  If your blood pressure at your visit was 140/90 or greater, please contact your primary care physician to follow up on this.  _______________________________________________________  If you are age 31 or older, your body mass index should be between 23-30. Your Body mass index is 21.2 kg/m. If this is out of the aforementioned range listed, please consider follow up with your Primary Care Provider.  If you are age 29  or younger, your body mass index should be between 19-25. Your Body mass index is 21.2 kg/m. If this is out of the aformentioned range listed, please consider follow up with your Primary Care Provider.   ________________________________________________________  The [facility] providers would like to encourage you to use MYCHART to communicate with providers for non-urgent requests or questions.  Due to long hold times on the telephone, sending your provider a message by [facility] may be a faster and more efficient way to get a response.  Please allow 48 business hours for a response.  Please remember that this is for non-urgent requests.  _______________________________________________________  [facility] is using a team-based approach to care.  Your team is made up of your doctor and two to three APPS. Our APPS (Nurse Practitioners and Physician Assistants) work with your physician to ensure care continuity for you. They are fully qualified to address your health concerns and develop a treatment plan. They communicate directly with your gastroenterologist to care for you. Seeing the Advanced Practice Practitioners on your physician's team can help you by facilitating care more promptly, often allowing for earlier appointments, access to diagnostic testing, procedures, and other specialty referrals.   Due to recent changes in healthcare laws, you may see the results of your imaging and laboratory studies on MyChart before your provider has had a chance to review them.  We understand that in some cases there may be results that are confusing or concerning to you. Not all laboratory results come back in the same time frame and the provider may be waiting for multiple results in order to interpret others.  Please give us  48 hours in order for your  provider to thoroughly review all the results before contacting the office for clarification of your results.

## 2024-02-04 NOTE — Progress Notes (Signed)
 "  Chief Complaint: Epigastric ab pain  HPI : 79 year old female with history of GERD, Barrett's esophagus, and headache presents with epigastric ab pain  She has had really bad diarrhea in the past, which may have been attributed to the pantoprazole. She has been on pantoprazole twice daily for many years. Her diarrhea has improved over time. She no longer experiences diarrhea. She has been losing hair so she wonders if she may have a vitamin deficiency. She may have lost some weight, though her weight in clinic today appears to be stable from prior. If she misses her dose of pantoprazole, then she will have epigastric ab pain. She stopped drinking coffee to try help with her symptoms. Dark chocolate does hurt her stomach. There is no clear correlation of epigastric ab pain with fatty foods. She can feel food going down at times. Denies overt dysphagia. Denies N&V. Denies rectal bleeding. OTC antacids seem to help with her ab pain. Denies family history of GI cancers. Last colonoscopy was in 2021 with just hemorrhoids. Last EGD was in 2023 with a small hiatal hernia and an irregular Z-line.  Wt Readings from Last 3 Encounters:  02/04/24 127 lb 6 oz (57.8 kg)  07/25/20 125 lb (56.7 kg)  04/25/20 128 lb (58.1 kg)   Past Medical History:  Diagnosis Date   Barrett's esophagus    GERD (gastroesophageal reflux disease)    Headache    Hyperlipidemia      Past Surgical History:  Procedure Laterality Date   ABDOMINAL HYSTERECTOMY     BREAST BIOPSY     non cancerous   FACIAL COSMETIC SURGERY  2001   Family History  Problem Relation Age of Onset   Heart disease Mother    Heart disease Father    Liver cancer Sister    Rheum arthritis Mother    Pneumonia Brother    Social History[1] Current Outpatient Medications  Medication Sig Dispense Refill   acetaminophen  (TYLENOL ) 500 MG tablet 1 tablet as needed Orally every 6 hrs     amLODipine (NORVASC) 5 MG tablet 1 tablet Orally Once a day;  Duration: 90 days     atorvastatin (LIPITOR) 40 MG tablet Take 40 mg by mouth daily.     BIOTIN PO Take by mouth daily.     diazepam (VALIUM) 5 MG tablet Take 5 mg by mouth daily as needed.     estradiol (ESTRACE) 0.5 MG tablet 1 tablet Orally Once a day; Duration: 90 days     fluticasone (FLONASE) 50 MCG/ACT nasal spray 1 spray in each nostril Nasally Twice a day; Duration: 90 days     Magnesium 250 MG TABS 1 tablet with a meal Orally Once a day     melatonin 3 MG TABS tablet Take by mouth.     Multiple Vitamin (MULTIVITAMIN) tablet Take 1 tablet by mouth daily.     pantoprazole (PROTONIX) 40 MG tablet Take 40 mg by mouth daily.  (Patient taking differently: Take 40 mg by mouth 2 (two) times daily.)  5   PREMARIN 1.25 MG tablet Take 0.31 mg by mouth daily.   4   Probiotic Product (ALIGN PO) Take 1 tablet by mouth daily.     telmisartan (MICARDIS) 80 MG tablet 1 tablet Orally Once a day; Duration: 90 days     tretinoin (RETIN-A) 0.1 % cream APPLY 1 CREAM TOPICALLY EVERY DAY AT BEDTIME     VITAMIN D , CHOLECALCIFEROL, PO Take 1 capsule by mouth daily.  No current facility-administered medications for this visit.   Allergies[2]   Review of Systems: All systems reviewed and negative except where noted in HPI.   Physical Exam: BP (!) 108/50   Pulse 62   Ht 5' 5 (1.651 m)   Wt 127 lb 6 oz (57.8 kg)   BMI 21.20 kg/m  Constitutional: Pleasant,well-developed, female in no acute distress. HEENT: Normocephalic and atraumatic. Conjunctivae are normal. No scleral icterus. Cardiovascular: Normal rate, regular rhythm.  Pulmonary/chest: Effort normal and breath sounds normal. No wheezing, rales or rhonchi. Abdominal: Soft, nondistended, nontender. Bowel sounds active throughout. There are no masses palpable. No hepatomegaly. Extremities: No edema Neurological: Alert and oriented to person place and time. Skin: Skin is warm and dry. No rashes noted. Psychiatric: Normal mood and affect.  Behavior is normal.  Labs 06/2019: TTG IgA negative.  Labs 01/2024: CBC nml. CMP unremarkable.  PET CT 12/30/19: IMPRESSION:  1. No evidence of well differentiated neuroendocrine tumor on skull  base to DOTATATE PET scan.  2. No bowel obstruction or bowel lesion.  3. No mesenteric mass or  retroperitoneal lesion.   EGD/Colonoscopy (07/21/19) = irregular z-line (bx positive for BE w/o dysplasia), mild Gastritis (bx neg for H pylori), normal duodenum (bx neg for Celiac Dz), normal colonic mucosa (bx neg for microscopic colitis), and internal hemorrhoids.   EGD 01/31/21: Findings  Irregular Z-line 38 cm from the incisors; performed cold forceps biopsy. 4  quadrant biopsies were taken and submitted to pathology. The esophagus was  otherwise normal.  Small hiatal hernia  Mild, patchy erythematous mucosa in the body of the stomach, antrum and  prepyloric region, consistent with gastritis; performed cold forceps  biopsy to rule out H. pylori. The stomach was otherwise unremarkable.  The duodenal bulb, 1st part of the duodenum and 2nd part of the duodenum  appeared normal.  Path: A. STOMACH, RANDOM, ENDOSCOPIC BIOPSY: CHRONIC GASTRITIS. FEATURES OF HELICOBACTER GASTRITIS NOT IDENTIFIED. B. GASTROESOPHAGEAL JUNCTION, ENDOSCOPIC BIOPSY: CHRONIC INFLAMMATION. NO EVIDENCE OF INTESTINAL METAPLASIA, DYSPLASIA, OR MALIGNANCY.   ASSESSMENT AND PLAN: Epigastric ab pain GERD Chronic gastritis Hair loss Barrett's esophagus Small hiatal hernia Patient presents with epigastric ab pain that seems to improve with antacid therapy and worsen with ingestion of certain foods such as dark chocolate. Will plan for further work up with labs, RUQ U/S to rule out gallbladder involvement, and EGD. Patient has a history of Barrett's esophagus based upon biopsies of an irregular Z-line back in 2021. Her last EGD in 2023 did not show any signs of Barrett's esophagus based upon biopsies so will also plan for  surveillance at that time. Will check for vitamin deficiencies since patient does describe some hair loss. - Went over reflux friendly diet with the patient - Check lipase, ferritin/IBC, vitamin B12, folate, vitamin D  - Cont pantoprazole 40 mg BID - RUQ U/S - EGD [facility]  Estefana Kidney, MD  I spent 48 minutes of time, including in depth chart review, independent review of results as outlined above, communicating results with the patient directly, face-to-face time with the patient, coordinating care, ordering studies and medications as appropriate, and documentation.      [1]  Social History Tobacco Use   Smoking status: Never   Smokeless tobacco: Never  Substance Use Topics   Alcohol use: Yes    Alcohol/week: 0.0 standard drinks of alcohol    Comment: once a week   Drug use: No  [2]  Allergies Allergen Reactions   Rosuvastatin Other (See Comments)   "

## 2024-02-10 ENCOUNTER — Ambulatory Visit (HOSPITAL_BASED_OUTPATIENT_CLINIC_OR_DEPARTMENT_OTHER)

## 2024-03-03 ENCOUNTER — Ambulatory Visit (HOSPITAL_BASED_OUTPATIENT_CLINIC_OR_DEPARTMENT_OTHER)

## 2024-03-05 ENCOUNTER — Encounter: Admitting: Internal Medicine
